# Patient Record
Sex: Male | Born: 1955 | Race: White | Hispanic: No | Marital: Married | Smoking: Current every day smoker
Health system: Southern US, Community
[De-identification: ages and names within clinical notes are randomized; demographics above are authoritative.]

## PROBLEM LIST (undated history)

## (undated) DIAGNOSIS — K219 Gastro-esophageal reflux disease without esophagitis: Secondary | ICD-10-CM

## (undated) HISTORY — PX: ANTERIOR CRUCIATE LIGAMENT REPAIR: SHX115

## (undated) HISTORY — PX: NECK SURGERY: SHX720

---

## 2015-12-02 ENCOUNTER — Other Ambulatory Visit: Payer: Self-pay | Admitting: Family Medicine

## 2015-12-02 DIAGNOSIS — R945 Abnormal results of liver function studies: Principal | ICD-10-CM

## 2015-12-02 DIAGNOSIS — R7989 Other specified abnormal findings of blood chemistry: Secondary | ICD-10-CM

## 2015-12-24 ENCOUNTER — Other Ambulatory Visit: Payer: Self-pay | Admitting: Family Medicine

## 2015-12-24 ENCOUNTER — Ambulatory Visit
Admission: RE | Admit: 2015-12-24 | Discharge: 2015-12-24 | Disposition: A | Discharge status: Home / Self Care | Payer: BLUE CROSS/BLUE SHIELD | Source: Ambulatory Visit | Attending: Family Medicine | Admitting: Family Medicine

## 2015-12-24 DIAGNOSIS — R945 Abnormal results of liver function studies: Principal | ICD-10-CM

## 2015-12-24 DIAGNOSIS — R7989 Other specified abnormal findings of blood chemistry: Secondary | ICD-10-CM

## 2015-12-24 DIAGNOSIS — M1610 Unilateral primary osteoarthritis, unspecified hip: Secondary | ICD-10-CM

## 2015-12-24 DIAGNOSIS — M1990 Unspecified osteoarthritis, unspecified site: Secondary | ICD-10-CM

## 2016-01-14 ENCOUNTER — Encounter: Payer: Self-pay | Admitting: Vascular Surgery

## 2016-01-22 ENCOUNTER — Ambulatory Visit (INDEPENDENT_AMBULATORY_CARE_PROVIDER_SITE_OTHER): Payer: BLUE CROSS/BLUE SHIELD | Admitting: Vascular Surgery

## 2016-01-22 ENCOUNTER — Encounter: Payer: Self-pay | Admitting: Vascular Surgery

## 2016-01-22 VITALS — BP 120/81 | HR 92 | Temp 98.4°F | Resp 16 | Ht 75.0 in | Wt 227.0 lb

## 2016-01-22 DIAGNOSIS — I714 Abdominal aortic aneurysm, without rupture, unspecified: Secondary | ICD-10-CM

## 2016-01-22 NOTE — Progress Notes (Signed)
History of Present Illness:  Patient is a 60 y.o. year old male who presents for evaluation of abdominal aortic aneurysm.  The aneurysm is currently 5.2cm in diameter by US performed [facility] on 12/24/2015.  The patient denies abdominal pain.  The patient denies back pain.  The patient hadenies family history of AAA.  Other medical problems include elevated LFTs and tobacco abuse.  He takes no prescriptions medications.  He denise DM, HTN and hypercholesterolemia.  History reviewed. No pertinent past medical history.  History reviewed. No pertinent surgical history.   Social History Social History  Substance Use Topics  . Smoking status: Current Every Day Smoker  . Smokeless tobacco: Never Used     Comment: Less than 1 pk per day.   . Alcohol use Not on file    Family History History reviewed. No pertinent family history.  Allergies  Not on File   No current outpatient prescriptions on file.   No current facility-administered medications for this visit.     ROS:   General:  No weight loss, Fever, chills  HEENT: No recent headaches, no nasal bleeding, no visual changes, no sore throat  Neurologic: No dizziness, blackouts, seizures. No recent symptoms of stroke or mini- stroke. No recent episodes of slurred speech, or temporary blindness.  Cardiac: No recent episodes of chest pain/pressure, no shortness of breath at rest.  No shortness of breath with exertion.  Denies history of atrial fibrillation or irregular heartbeat  Vascular: No history of rest pain in feet.  No history of claudication.  No history of non-healing ulcer, No history of DVT   Pulmonary: No home oxygen, no productive cough, no hemoptysis,  No asthma or wheezing  Musculoskeletal:  [ ]  Arthritis, [ ]  Low back pain,  [ ]  Joint pain  Hematologic:No history of hypercoagulable state.  No history of easy bleeding.  No history of anemia  Gastrointestinal: No hematochezia or melena,  No  gastroesophageal reflux, no trouble swallowing  Urinary: [ ]  chronic Kidney disease, [ ]  on HD - [ ]  MWF or [ ]  TTHS, [ ]  Burning with urination, [ ]  Frequent urination, [ ]  Difficulty urinating;   Skin: No rashes  Psychological: No history of anxiety,  No history of depression   Physical Examination  Vitals:   01/22/16 1503  BP: 120/81  Pulse: 92  Resp: 16  Temp: 98.4 F (36.9 C)  TempSrc: Oral  SpO2: 97%  Weight: 227 lb (103 kg)  Height: 6\' 3"  (1.905 m)    Body mass index is 28.37 kg/m.  General:  Alert and oriented, no acute distress HEENT: Normal Neck: No bruit or JVD Pulmonary: Clear to auscultation bilaterally Cardiac: Regular Rate and Rhythm without murmur Gastrointestinal: Soft, non-tender, non-distended, no mass, no scars Skin: No rash Extremity Pulses:  2+ radial, brachial, femoral, dorsalis pedis, posterior tibial pulses bilaterally Musculoskeletal: No deformity or edema  Neurologic: Upper and lower extremity motor 5/5 and symmetric  DATA:  Abdominal ultrasounds 12/24/2015 [facility]  Infrarenal AAA diameter of 5.2 x 5.1 cm  ASSESSMENT:  5.2 Infrarenal AAA asymptomatic  PLAN: We will order a CTA ABD/Pelvis and get him to a cardiologist for cardiac clearance.  It is recommended to repair the AAA when it is larger than 5.0 cm in diameter.  He will then follow up with Dr. Arbie CookeyEarly to plan open AAA verse EVAR fixation of the AAA.   COLLINS, EMMA MAUREEN PA-C [facility]  He was seen today in conjunction with Dr. Arbie Cookey  I have examined the patient, reviewed and agree with above.Discussed options with the patient and his wife present at length. Will obtain CT scan to determine if he is a anatomic candidate for stent graft repair. Does not have any prior cardiac history but will obtain cardiac clearance prior to surgery. We'll see him back for continued discussion following CT imaging  Gretta Began, MD 01/22/2016 4:36  PM '

## 2016-01-23 ENCOUNTER — Other Ambulatory Visit: Payer: Self-pay | Admitting: Vascular Surgery

## 2016-01-23 ENCOUNTER — Other Ambulatory Visit: Payer: Self-pay

## 2016-01-23 DIAGNOSIS — I714 Abdominal aortic aneurysm, without rupture, unspecified: Secondary | ICD-10-CM

## 2016-01-23 NOTE — Addendum Note (Signed)
Addended by: Yolonda KidaEVANS, Arthur Aydelotte N on: 01/23/2016 02:37 PM   Modules accepted: Orders

## 2016-01-29 ENCOUNTER — Encounter: Payer: Self-pay | Admitting: Cardiovascular Disease

## 2016-01-29 ENCOUNTER — Ambulatory Visit (INDEPENDENT_AMBULATORY_CARE_PROVIDER_SITE_OTHER): Payer: BLUE CROSS/BLUE SHIELD | Admitting: Cardiovascular Disease

## 2016-01-29 VITALS — BP 130/80 | HR 64 | Ht 75.0 in | Wt 220.0 lb

## 2016-01-29 DIAGNOSIS — I714 Abdominal aortic aneurysm, without rupture, unspecified: Secondary | ICD-10-CM | POA: Insufficient documentation

## 2016-01-29 DIAGNOSIS — Z01818 Encounter for other preprocedural examination: Secondary | ICD-10-CM | POA: Diagnosis not present

## 2016-01-29 NOTE — Progress Notes (Signed)
     01/29/2016 Kyle Luna   August 02, 1955  [id number]  Primary Physician Maryelizabeth RowanEWEY,ELIZABETH, MD Primary Cardiologist: Runell GessJonathan J Francesa Eugenio MD Roseanne RenoFACP, FACC, FAHA, FSCAI  HPI:  Kyle Luna is a delightful 60 year old mildly overweight married Caucasian male who children referred by Dr. EARLY for preoperative clearance before abdominal aortic aneurysm revascularization. He has no prior cardiac history. Risk factors 20-pack-years of tobacco abuse smoking one half pack per day. He's never had a heart attack or stroke. Denies chest pain or shortness of breath. Apparently an abdominal ultrasound showed a 5 cm abdominal aortic aneurysm which led to referral to vascular surgery. He is scheduled for a CT scan in the upcoming future to determine suitability for endoluminal stent grafting. He was referred here for preoperative clearance.   No current outpatient prescriptions on file.   No current facility-administered medications for this visit.     No Known Allergies  Social History   Social History  . Marital status: Married    Spouse name: N/A  . Number of children: N/A  . Years of education: N/A   Occupational History  . Not on file.   Social History Main Topics  . Smoking status: Current Every Day Smoker  . Smokeless tobacco: Never Used     Comment: Less than 1 pk per day.   . Alcohol use 2.4 - 3.0 oz/week    4 - 5 Cans of beer per week  . Drug use: No  . Sexual activity: Not on file   Other Topics Concern  . Not on file   Social History Narrative  . No narrative on file     Review of Systems: General: negative for chills, fever, night sweats or weight changes.  Cardiovascular: negative for chest pain, dyspnea on exertion, edema, orthopnea, palpitations, paroxysmal nocturnal dyspnea or shortness of breath Dermatological: negative for rash Respiratory: negative for cough or wheezing Urologic: negative for hematuria Abdominal: negative for nausea, vomiting, diarrhea, bright red  blood per rectum, melena, or hematemesis Neurologic: negative for visual changes, syncope, or dizziness All other systems reviewed and are otherwise negative except as noted above.    Blood pressure 130/80, pulse 64, height 6\' 3"  (1.905 m), weight 220 lb (99.8 kg).  General appearance: alert and no distress Neck: no adenopathy, no carotid bruit, no JVD, supple, symmetrical, trachea midline and thyroid not enlarged, symmetric, no tenderness/mass/nodules Lungs: clear to auscultation bilaterally Heart: regular rate and rhythm, S1, S2 normal, no murmur, click, rub or gallop Extremities: extremities normal, atraumatic, no cyanosis or edema  EKG normal sinus rhythm at 64 without ST or T-wave changes. I personally reviewed this EKG  ASSESSMENT AND PLAN:   Abdominal aortic aneurysm (AAA) [facility](HCC) Kyle Luna were presents today for preoperative clearance before abdominal aortic aneurysm repair by Dr. Arbie CookeyEarly. His resection profiles are notable for 20-pack-years of tobacco continued to smoke one half pack per day. There is no history of diabetes, hypertension or hyperlipidemia. He was adopted so there is no known family history. He denies chest pain or shortness of breath. Never had a heart attack or stroke. While he may be a candidate for endoluminal stent grafting aortobifemoral bypass grafting remains a possibility. I'm going to obtain a pharmacologic Myoview stress test to risk stratify him.      Runell GessJonathan J. Rudra Hobbins MD FACP,FACC,FAHA, [facility] 01/29/2016 8:55 AM

## 2016-01-29 NOTE — Patient Instructions (Signed)
Medication Instructions:  Your physician recommends that you continue on your current medications as directed. Please refer to the Current Medication list given to you today.  Labwork: none  Testing/Procedures: Your physician has requested that you have a lexiscan myoview. For further information please visit [email or web address]. Please follow instruction sheet, as given.  Follow-Up: As needed     

## 2016-01-29 NOTE — Assessment & Plan Note (Signed)
Mr. Kyle Luna were presents today for preoperative clearance before abdominal aortic aneurysm repair by Dr. Arbie CookeyEarly. His resection profiles are notable for 20-pack-years of tobacco continued to smoke one half pack per day. There is no history of diabetes, hypertension or hyperlipidemia. He was adopted so there is no known family history. He denies chest pain or shortness of breath. Never had a heart attack or stroke. While he may be a candidate for endoluminal stent grafting aortobifemoral bypass grafting remains a possibility. I'm going to obtain a pharmacologic Myoview stress test to risk stratify him.

## 2016-01-30 ENCOUNTER — Encounter (HOSPITAL_COMMUNITY): Payer: BLUE CROSS/BLUE SHIELD

## 2016-01-30 ENCOUNTER — Encounter: Payer: Self-pay | Admitting: Vascular Surgery

## 2016-01-31 ENCOUNTER — Ambulatory Visit
Admission: RE | Admit: 2016-01-31 | Discharge: 2016-01-31 | Disposition: A | Discharge status: Home / Self Care | Payer: BLUE CROSS/BLUE SHIELD | Source: Ambulatory Visit | Attending: Vascular Surgery | Admitting: Vascular Surgery

## 2016-01-31 DIAGNOSIS — I714 Abdominal aortic aneurysm, without rupture, unspecified: Secondary | ICD-10-CM

## 2016-01-31 MED ORDER — IOPAMIDOL (ISOVUE-370) INJECTION 76%
80.0000 mL | Freq: Once | INTRAVENOUS | Status: AC | PRN
  Administered 2016-01-31: 80 mL via INTRAVENOUS

## 2016-02-04 ENCOUNTER — Ambulatory Visit (INDEPENDENT_AMBULATORY_CARE_PROVIDER_SITE_OTHER): Payer: BLUE CROSS/BLUE SHIELD | Admitting: Vascular Surgery

## 2016-02-04 ENCOUNTER — Encounter: Payer: Self-pay | Admitting: Vascular Surgery

## 2016-02-04 VITALS — BP 137/91 | HR 77 | Ht 75.0 in | Wt 220.8 lb

## 2016-02-04 DIAGNOSIS — I714 Abdominal aortic aneurysm, without rupture, unspecified: Secondary | ICD-10-CM

## 2016-02-04 NOTE — Progress Notes (Signed)
Patient name: Kyle Luna Lanzer MRN: [id number] DOB: 11/14/1955 Sex: male  REASON FOR VISIT: Follow-up abdominal aortic aneurysm and discuss recent CT  HPI: Kyle Luna Nobrega is a 60 y.o. male here for continued discussion of his abdominal aortic aneurysm. I'd seen him several weeks ago in initial consultation. He was found also to have a 5.2 cm asymptomatic infrarenal abdominal aortic aneurysm. He underwent CT angiogram of his abdomen and pelvis on 01/31/2016. I have reviewed his actual films and reports and have reviewed his films today with the patient and his wife present. He has no symptoms of his aneurysm and no change in his medical history since her visit several weeks ago. He does have a cardiology appointment for clearance on 02/07/2016  History reviewed. No pertinent past medical history.  Family History  Problem Relation Age of Onset  . Adopted: Yes    SOCIAL HISTORY: Social History  Substance Use Topics  . Smoking status: Current Every Day Smoker  . Smokeless tobacco: Never Used     Comment: Less than 1 pk per day.   . Alcohol use 2.4 - 3.0 oz/week    4 - 5 Cans of beer per week    No Known Allergies  No current outpatient prescriptions on file.   No current facility-administered medications for this visit.     REVIEW OF SYSTEMS:  [X]  denotes positive finding, [ ]  denotes negative finding Cardiac  Comments:  Chest pain or chest pressure:    Shortness of breath upon exertion:    Short of breath when lying flat:    Irregular heart rhythm:        Vascular    Pain in calf, thigh, or hip brought on by ambulation:    Pain in feet at night that wakes you up from your sleep:     Blood clot in your veins:    Leg swelling:         Pulmonary    Oxygen at home:    Productive cough:     Wheezing:         Neurologic    Sudden weakness in arms or legs:     Sudden numbness in arms or legs:     Sudden onset of difficulty speaking or slurred speech:    Temporary loss of  vision in one eye:     Problems with dizziness:         Gastrointestinal    Blood in stool:     Vomited blood:         Genitourinary    Burning when urinating:     Blood in urine:        Psychiatric    Major depression:         Hematologic    Bleeding problems:    Problems with blood clotting too easily:        Skin    Rashes or ulcers:        Constitutional    Fever or chills:      PHYSICAL EXAM: Vitals:   02/04/16 0903  BP: (!) 137/91  Pulse: 77  SpO2: 97%  Weight: 220 lb 12.8 oz (100.2 kg)  Height: 6\' 3"  (1.905 m)    GENERAL: The patient is a well-nourished male, in no acute distress. The vital signs are documented above. PULMONARY: There is good air exchange  MUSCULOSKELETAL: There are no major deformities or cyanosis. NEUROLOGIC: No focal weakness or paresthesias are detected. SKIN: There are no ulcers or rashes  noted. PSYCHIATRIC: The patient has a normal affect.  DATA:   CT scan reveals an infrarenal abdominal aortic aneurysm with maximal diameter 5.3 cm. He does have tension down to the iliac arteries but does not extend into the iliac arteries. He does have good caliber iliac vessels for potential delivery of the stent graft. 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  MEDICAL ISSUES:  Again had long discussion with the patient and his wife regarding the options of stent graft repair and open aneurysm repair. Did explain the potential for Endo leaks with stent graft repair and the requirement for ongoing lifelong surveillance. I will review his CT scan with the device manufacturer. Explained would in all likelihood require coverage of his lower pole accessory renal arteries but this typically does not cause any untoward events. Will make final recommendation following cardiac clearance and after review of the CT scan. He wishes to proceed as soon as possible    Jakwan Sally, Sales promotion account executive  of [employer] [phone]

## 2016-02-05 ENCOUNTER — Telehealth (HOSPITAL_COMMUNITY): Payer: Self-pay

## 2016-02-05 NOTE — Telephone Encounter (Signed)
Encounter complete. 

## 2016-02-07 ENCOUNTER — Ambulatory Visit (HOSPITAL_COMMUNITY)
Admission: RE | Admit: 2016-02-07 | Discharge: 2016-02-07 | Disposition: A | Discharge status: Home / Self Care | Payer: BLUE CROSS/BLUE SHIELD | Source: Ambulatory Visit | Attending: Cardiology | Admitting: Cardiology

## 2016-02-07 DIAGNOSIS — Z720 Tobacco use: Secondary | ICD-10-CM | POA: Insufficient documentation

## 2016-02-07 DIAGNOSIS — Z01818 Encounter for other preprocedural examination: Secondary | ICD-10-CM

## 2016-02-07 DIAGNOSIS — I714 Abdominal aortic aneurysm, without rupture, unspecified: Secondary | ICD-10-CM

## 2016-02-07 LAB — MYOCARDIAL PERFUSION IMAGING
CHL CUP NUCLEAR SRS: 5
CSEPPHR: 89 {beats}/min
LV dias vol: 94 mL (ref 62–150)
LV sys vol: 45 mL
Rest HR: 70 {beats}/min
SDS: 0
SSS: 5
TID: 1.24

## 2016-02-07 MED ORDER — TECHNETIUM TC 99M TETROFOSMIN IV KIT
10.7000 | PACK | Freq: Once | INTRAVENOUS | Status: AC | PRN
  Administered 2016-02-07: 11 via INTRAVENOUS
  Filled 2016-02-07: qty 11

## 2016-02-07 MED ORDER — TECHNETIUM TC 99M TETROFOSMIN IV KIT
31.2000 | PACK | Freq: Once | INTRAVENOUS | Status: AC | PRN
  Administered 2016-02-07: 31.2 via INTRAVENOUS
  Filled 2016-02-07: qty 31

## 2016-02-07 MED ORDER — REGADENOSON 0.4 MG/5ML IV SOLN
0.4000 mg | Freq: Once | INTRAVENOUS | Status: AC
  Administered 2016-02-07: 0.4 mg via INTRAVENOUS

## 2016-02-10 ENCOUNTER — Other Ambulatory Visit: Payer: Self-pay

## 2016-02-28 NOTE — Pre-Procedure Instructions (Signed)
Kyle Luna  02/28/2016      [id number] - [name], [place] - [address] AT [facility] [address] [facility] [name] [name] [address] Phone: [phone] Fax: [phone]    Your procedure is scheduled on Monday, October 23.  Report to [facility] Admitting at 5:30 AM               Your surgery or procedure is scheduled for 7:30 AM   Call this number if you have problems the morning of [phone]                 For any other questions, please call [phone], Monday - Friday 8 AM - 4 PM.   Remember:  Do not eat food or drink liquids after midnight: Sunday, October 23.               1 Week prior to surgery STOP taking Aspirin , Aspirin Products (Goody Powder, Excedrin Migraine), Ibuprofen (Advil), Naproxen (Aleve), Vitamins and Herbal Products (ie Fish Oil                                         [place]- Preparing For Surgery Before surgery, you can play an important role. Because skin is not sterile, your skin needs to be as free of germs as possible. You can reduce the number of germs on your skin by washing with CHG (chlorahexidine gluconate) Soap before surgery.  CHG is an antiseptic cleaner which kills germs and bonds with the skin to continue killing germs even after washing.  Please do not use if you have an allergy to CHG or antibacterial soaps. If your skin becomes reddened/irritated stop using the CHG.  Do not shave (including legs and underarms) for at least 48 hours prior to first CHG shower. It is OK to shave your face.  Please follow these instructions carefully.   1. Shower the NIGHT BEFORE SURGERY and the MORNING OF SURGERY with CHG.   2. If you chose to wash your hair, wash your hair first as usual with your normal shampoo.  3. After you shampoo, rinse your hair and body thoroughly to remove the shampoo.  4. Use CHG as you would any other liquid soap. You can apply CHG directly to the skin and wash  gently with a scrungie or a clean washcloth.   5. Apply the CHG Soap to your body ONLY FROM THE NECK DOWN.  Do not use on open wounds or open sores. Avoid contact with your eyes, ears, mouth and genitals (private parts). Wash genitals (private parts) with your normal soap.  6. Wash thoroughly, paying special attention to the area where your surgery will be performed.  7. Thoroughly rinse your body with warm water from the neck down.  8. DO NOT shower/wash with your normal soap after using and rinsing off the CHG Soap.  9. Pat yourself dry with a CLEAN TOWEL.   10. Wear CLEAN PAJAMAS   11. Place CLEAN SHEETS on your bed the night of your first shower and DO NOT SLEEP WITH PETS.  Day of Surgery: Do not apply any deodorants/lotions. Please wear clean clothes to the hospital.  Do not wear jewelry, make-up or nail polish.  Do not wear lotions, powders, or perfumes, or deodorant.             Men may shave face and neck.  Do not bring valuables to the hospital.  [facility] is not responsible for any belongings or valuables.  Contacts, dentures or bridgework may not be worn into surgery.  Leave your suitcase in the car.  After surgery it may be brought to your room.  For patients admitted to the hospital, discharge time will be determined by your treatment team.  Special instructions:    Please read over the following fact sheets that you were given: [facility]- Preparing For Surgery and Patient Instructions for Mupirocin Application, Pain Booklet, Coughing and Deep Breathing.

## 2016-03-02 ENCOUNTER — Encounter (HOSPITAL_COMMUNITY)
Admission: RE | Admit: 2016-03-02 | Discharge: 2016-03-02 | Disposition: A | Discharge status: Home / Self Care | Payer: BLUE CROSS/BLUE SHIELD | Source: Ambulatory Visit | Attending: Vascular Surgery | Admitting: Vascular Surgery

## 2016-03-02 ENCOUNTER — Telehealth: Payer: Self-pay

## 2016-03-02 ENCOUNTER — Encounter (HOSPITAL_COMMUNITY): Payer: Self-pay

## 2016-03-02 DIAGNOSIS — I714 Abdominal aortic aneurysm, without rupture: Secondary | ICD-10-CM | POA: Insufficient documentation

## 2016-03-02 DIAGNOSIS — Z01812 Encounter for preprocedural laboratory examination: Secondary | ICD-10-CM | POA: Diagnosis not present

## 2016-03-02 HISTORY — DX: Gastro-esophageal reflux disease without esophagitis: K21.9

## 2016-03-02 LAB — BLOOD GAS, ARTERIAL
ACID-BASE EXCESS: 2 mmol/L (ref 0.0–2.0)
BICARBONATE: 26 mmol/L (ref 20.0–28.0)
DRAWN BY: 257081
FIO2: 21
O2 SAT: 96.4 %
PATIENT TEMPERATURE: 98.6
PH ART: 7.422 (ref 7.350–7.450)
pCO2 arterial: 40.7 mmHg (ref 32.0–48.0)
pO2, Arterial: 85.1 mmHg (ref 83.0–108.0)

## 2016-03-02 LAB — TYPE AND SCREEN
ABO/RH(D): A POS
ANTIBODY SCREEN: NEGATIVE

## 2016-03-02 LAB — CBC
HEMATOCRIT: 47.1 % (ref 39.0–52.0)
Hemoglobin: 16.3 g/dL (ref 13.0–17.0)
MCH: 31.3 pg (ref 26.0–34.0)
MCHC: 34.6 g/dL (ref 30.0–36.0)
MCV: 90.4 fL (ref 78.0–100.0)
PLATELETS: 291 10*3/uL (ref 150–400)
RBC: 5.21 MIL/uL (ref 4.22–5.81)
RDW: 14.2 % (ref 11.5–15.5)
WBC: 8.1 10*3/uL (ref 4.0–10.5)

## 2016-03-02 LAB — URINALYSIS, ROUTINE W REFLEX MICROSCOPIC
BILIRUBIN URINE: NEGATIVE
Glucose, UA: NEGATIVE mg/dL
HGB URINE DIPSTICK: NEGATIVE
KETONES UR: 15 mg/dL — AB
Leukocytes, UA: NEGATIVE
NITRITE: NEGATIVE
PH: 5.5 (ref 5.0–8.0)
Protein, ur: NEGATIVE mg/dL
SPECIFIC GRAVITY, URINE: 1.024 (ref 1.005–1.030)

## 2016-03-02 LAB — SURGICAL PCR SCREEN
MRSA, PCR: NEGATIVE
Staphylococcus aureus: NEGATIVE

## 2016-03-02 LAB — COMPREHENSIVE METABOLIC PANEL
ALBUMIN: 4.3 g/dL (ref 3.5–5.0)
ALT: 57 U/L (ref 17–63)
ANION GAP: 10 (ref 5–15)
AST: 39 U/L (ref 15–41)
Alkaline Phosphatase: 57 U/L (ref 38–126)
BILIRUBIN TOTAL: 0.7 mg/dL (ref 0.3–1.2)
BUN: 15 mg/dL (ref 6–20)
CHLORIDE: 104 mmol/L (ref 101–111)
CO2: 26 mmol/L (ref 22–32)
Calcium: 10 mg/dL (ref 8.9–10.3)
Creatinine, Ser: 0.93 mg/dL (ref 0.61–1.24)
GFR calc Af Amer: >60 mL/min (ref >60)
GFR calc non Af Amer: >60 mL/min (ref >60)
GLUCOSE: 95 mg/dL (ref 65–99)
POTASSIUM: 4.5 mmol/L (ref 3.5–5.1)
SODIUM: 140 mmol/L (ref 135–145)
TOTAL PROTEIN: 7.9 g/dL (ref 6.5–8.1)

## 2016-03-02 LAB — ABO/RH: ABO/RH(D): A POS

## 2016-03-02 LAB — APTT: APTT: 32 s (ref 24–36)

## 2016-03-02 LAB — PROTIME-INR
INR: 0.95
PROTHROMBIN TIME: 12.7 s (ref 11.4–15.2)

## 2016-03-02 NOTE — Telephone Encounter (Signed)
Pt's wife called to request medication for anxiety or sleep.  Reported the pt. Is becoming increasingly anxious about the surgery scheduled to repair his abdominal aneurysm on 10/23.  Stated that he is afraid he is going to die.  Advised that she will need to contact his PCP re: any anti-anxiety or sleeping medication.  The wife verbalized that she felt this should be handled by the surgeon, and questioned this.  Wife stated that the pt. gets extremely anxious for dental procedures, and his BP will raise to 199/100; stated "so you can imagine what he is going through facing the aneurysm surgery.  Advised that to try his PCP, and if there is any problem with getting an Rx for the above, to contact our office again, and nurse will discuss with Dr. Arbie CookeyEarly.  Wife verb. Understanding.

## 2016-03-08 NOTE — Anesthesia Preprocedure Evaluation (Signed)
Anesthesia Evaluation  Patient identified by MRN, date of birth, ID band Patient awake    Reviewed: Allergy & Precautions, H&P , NPO status , Patient's Chart, lab work & pertinent test results  History of Anesthesia Complications Negative for: history of anesthetic complications  Airway Mallampati: II  TM Distance: >3 FB Neck ROM: full    Dental no notable dental hx.    Pulmonary Current Smoker,    Pulmonary exam normal breath sounds clear to auscultation       Cardiovascular + Peripheral Vascular Disease  Normal cardiovascular exam Rhythm:regular Rate:Normal  5.2cm infrarenal aneurysm, negative stress test on 01/30/16   Neuro/Psych negative neurological ROS     GI/Hepatic negative GI ROS, Neg liver ROS,   Endo/Other  negative endocrine ROS  Renal/GU negative Renal ROS     Musculoskeletal   Abdominal   Peds  Hematology negative hematology ROS (+)   Anesthesia Other Findings   Reproductive/Obstetrics negative OB ROS                             Anesthesia Physical Anesthesia Plan  ASA: III  Anesthesia Plan: General   Post-op Pain Management:    Induction: Intravenous  Airway Management Planned: Oral ETT  Additional Equipment: Arterial line and CVP  Intra-op Plan:   Post-operative Plan: Possible Post-op intubation/ventilation  Informed Consent: I have reviewed the patients History and Physical, chart, labs and discussed the procedure including the risks, benefits and alternatives for the proposed anesthesia with the patient or authorized representative who has indicated his/her understanding and acceptance.   Dental Advisory Given  Plan Discussed with: Anesthesiologist, CRNA and Surgeon  Anesthesia Plan Comments:         Anesthesia Quick Evaluation

## 2016-03-09 ENCOUNTER — Inpatient Hospital Stay (HOSPITAL_COMMUNITY): Payer: BLUE CROSS/BLUE SHIELD | Admitting: Certified Registered Nurse Anesthetist

## 2016-03-09 ENCOUNTER — Encounter (HOSPITAL_COMMUNITY): Payer: Self-pay | Admitting: Certified Registered Nurse Anesthetist

## 2016-03-09 ENCOUNTER — Encounter (HOSPITAL_COMMUNITY)
Admission: RE | Disposition: A | Discharge status: Home / Self Care | Payer: Self-pay | Source: Ambulatory Visit | Attending: Vascular Surgery

## 2016-03-09 ENCOUNTER — Inpatient Hospital Stay (HOSPITAL_COMMUNITY): Payer: BLUE CROSS/BLUE SHIELD

## 2016-03-09 ENCOUNTER — Inpatient Hospital Stay (HOSPITAL_COMMUNITY)
Admission: RE | Admit: 2016-03-09 | Discharge: 2016-03-14 | DRG: 269 | Disposition: A | Discharge status: Home / Self Care | Payer: BLUE CROSS/BLUE SHIELD | Source: Ambulatory Visit | Attending: Vascular Surgery | Admitting: Vascular Surgery

## 2016-03-09 DIAGNOSIS — R262 Difficulty in walking, not elsewhere classified: Secondary | ICD-10-CM

## 2016-03-09 DIAGNOSIS — Z9889 Other specified postprocedural states: Secondary | ICD-10-CM

## 2016-03-09 DIAGNOSIS — I10 Essential (primary) hypertension: Secondary | ICD-10-CM | POA: Diagnosis present

## 2016-03-09 DIAGNOSIS — I714 Abdominal aortic aneurysm, without rupture, unspecified: Secondary | ICD-10-CM | POA: Diagnosis present

## 2016-03-09 DIAGNOSIS — F17200 Nicotine dependence, unspecified, uncomplicated: Secondary | ICD-10-CM | POA: Diagnosis present

## 2016-03-09 DIAGNOSIS — Z7289 Other problems related to lifestyle: Secondary | ICD-10-CM | POA: Diagnosis not present

## 2016-03-09 HISTORY — PX: ABDOMINAL AORTIC ANEURYSM REPAIR: SHX42

## 2016-03-09 LAB — CBC
HCT: 38.8 % — ABNORMAL LOW (ref 39.0–52.0)
HEMATOCRIT: 39.9 % (ref 39.0–52.0)
Hemoglobin: 13.3 g/dL (ref 13.0–17.0)
Hemoglobin: 13.8 g/dL (ref 13.0–17.0)
MCH: 31.1 pg (ref 26.0–34.0)
MCH: 31.7 pg (ref 26.0–34.0)
MCHC: 34.3 g/dL (ref 30.0–36.0)
MCHC: 34.6 g/dL (ref 30.0–36.0)
MCV: 90.9 fL (ref 78.0–100.0)
MCV: 91.5 fL (ref 78.0–100.0)
PLATELETS: 243 10*3/uL (ref 150–400)
PLATELETS: 239 10*3/uL (ref 150–400)
RBC: 4.27 MIL/uL (ref 4.22–5.81)
RBC: 4.36 MIL/uL (ref 4.22–5.81)
RDW: 14 % (ref 11.5–15.5)
RDW: 14 % (ref 11.5–15.5)
WBC: 14.7 10*3/uL — ABNORMAL HIGH (ref 4.0–10.5)
WBC: 14.3 10*3/uL — AB (ref 4.0–10.5)

## 2016-03-09 LAB — BLOOD GAS, ARTERIAL
Acid-Base Excess: 0.8 mmol/L (ref 0.0–2.0)
BICARBONATE: 26 mmol/L (ref 20.0–28.0)
FIO2: 32
O2 Saturation: 96.8 %
PATIENT TEMPERATURE: 97.2
PH ART: 7.342 — AB (ref 7.350–7.450)
PO2 ART: 96.2 mmHg (ref 83.0–108.0)
pCO2 arterial: 48.8 mmHg — ABNORMAL HIGH (ref 32.0–48.0)

## 2016-03-09 LAB — PROTIME-INR
INR: 1.04
PROTHROMBIN TIME: 13.6 s (ref 11.4–15.2)

## 2016-03-09 LAB — BASIC METABOLIC PANEL
Anion gap: 7 (ref 5–15)
BUN: 12 mg/dL (ref 6–20)
CO2: 25 mmol/L (ref 22–32)
CREATININE: 0.87 mg/dL (ref 0.61–1.24)
Calcium: 9 mg/dL (ref 8.9–10.3)
Chloride: 107 mmol/L (ref 101–111)
GFR calc Af Amer: >60 mL/min (ref >60)
GLUCOSE: 137 mg/dL — AB (ref 65–99)
POTASSIUM: 3.9 mmol/L (ref 3.5–5.1)
SODIUM: 139 mmol/L (ref 135–145)

## 2016-03-09 LAB — MAGNESIUM: MAGNESIUM: 1.8 mg/dL (ref 1.7–2.4)

## 2016-03-09 LAB — APTT: aPTT: 30 seconds (ref 24–36)

## 2016-03-09 SURGERY — ANEURYSM ABDOMINAL AORTIC REPAIR
Anesthesia: General

## 2016-03-09 MED ORDER — SODIUM CHLORIDE 0.9 % IV SOLN: 500.0000 mL | Freq: Once | INTRAVENOUS | Status: DC | PRN

## 2016-03-09 MED ORDER — ACETAMINOPHEN 325 MG RE SUPP: 325.0000 mg | RECTAL | Status: DC | PRN

## 2016-03-09 MED ORDER — PHENYLEPHRINE HCL 10 MG/ML IJ SOLN
INTRAMUSCULAR | Status: DC | PRN
  Administered 2016-03-09: 20 ug/min via INTRAVENOUS

## 2016-03-09 MED ORDER — PROPOFOL 10 MG/ML IV BOLUS
INTRAVENOUS | Status: DC | PRN
  Administered 2016-03-09 (×2): 100 mg via INTRAVENOUS

## 2016-03-09 MED ORDER — CHLORHEXIDINE GLUCONATE CLOTH 2 % EX PADS: 6.0000 | MEDICATED_PAD | Freq: Once | CUTANEOUS | Status: DC

## 2016-03-09 MED ORDER — ALUM & MAG HYDROXIDE-SIMETH 200-200-20 MG/5ML PO SUSP: 15.0000 mL | ORAL | Status: DC | PRN

## 2016-03-09 MED ORDER — HYDROMORPHONE HCL 1 MG/ML IJ SOLN
INTRAMUSCULAR | Status: AC
  Filled 2016-03-09: qty 0.5

## 2016-03-09 MED ORDER — FENTANYL CITRATE (PF) 100 MCG/2ML IJ SOLN
INTRAMUSCULAR | Status: DC | PRN
  Administered 2016-03-09: 100 ug via INTRAVENOUS
  Administered 2016-03-09 (×3): 50 ug via INTRAVENOUS
  Administered 2016-03-09: 25 ug via INTRAVENOUS
  Administered 2016-03-09: 50 ug via INTRAVENOUS
  Administered 2016-03-09: 75 ug via INTRAVENOUS
  Administered 2016-03-09 (×2): 50 ug via INTRAVENOUS

## 2016-03-09 MED ORDER — PHENOL 1.4 % MT LIQD
1.0000 | OROMUCOSAL | Status: DC | PRN
Start: 2016-03-09 — End: 2016-03-14

## 2016-03-09 MED ORDER — ONDANSETRON HCL 4 MG/2ML IJ SOLN
4.0000 mg | Freq: Four times a day (QID) | INTRAMUSCULAR | Status: DC | PRN
Start: 2016-03-09 — End: 2016-03-14

## 2016-03-09 MED ORDER — ENOXAPARIN SODIUM 40 MG/0.4ML ~~LOC~~ SOLN
40.0000 mg | SUBCUTANEOUS | Status: DC
  Administered 2016-03-10 – 2016-03-14 (×5): 40 mg via SUBCUTANEOUS
  Filled 2016-03-09 (×6): qty 0.4

## 2016-03-09 MED ORDER — LIDOCAINE HCL (CARDIAC) 20 MG/ML IV SOLN
INTRAVENOUS | Status: DC | PRN
  Administered 2016-03-09: 100 mg via INTRAVENOUS

## 2016-03-09 MED ORDER — ONDANSETRON HCL 4 MG/2ML IJ SOLN: 4.0000 mg | Freq: Once | INTRAMUSCULAR | Status: DC | PRN

## 2016-03-09 MED ORDER — OXYCODONE HCL 5 MG/5ML PO SOLN: 5.0000 mg | Freq: Once | ORAL | Status: DC | PRN

## 2016-03-09 MED ORDER — ONDANSETRON HCL 4 MG/2ML IJ SOLN
INTRAMUSCULAR | Status: DC | PRN
Start: 2016-03-09 — End: 2016-03-09
  Administered 2016-03-09: 4 mg via INTRAVENOUS

## 2016-03-09 MED ORDER — FENTANYL CITRATE (PF) 100 MCG/2ML IJ SOLN
INTRAMUSCULAR | Status: AC
  Filled 2016-03-09: qty 2

## 2016-03-09 MED ORDER — METOPROLOL TARTRATE 5 MG/5ML IV SOLN: 2.0000 mg | INTRAVENOUS | Status: DC | PRN

## 2016-03-09 MED ORDER — PHENYLEPHRINE 40 MCG/ML (10ML) SYRINGE FOR IV PUSH (FOR BLOOD PRESSURE SUPPORT)
PREFILLED_SYRINGE | INTRAVENOUS | Status: AC
  Filled 2016-03-09: qty 10

## 2016-03-09 MED ORDER — GUAIFENESIN-DM 100-10 MG/5ML PO SYRP: 15.0000 mL | ORAL_SOLUTION | ORAL | Status: DC | PRN

## 2016-03-09 MED ORDER — DEXTROSE 5 % IV SOLN
1.5000 g | INTRAVENOUS | Status: AC
  Administered 2016-03-09: 1.5 g via INTRAVENOUS
  Filled 2016-03-09: qty 1.5

## 2016-03-09 MED ORDER — SODIUM CHLORIDE 0.9 % IV SOLN
INTRAVENOUS | Status: DC | PRN
  Administered 2016-03-09: 500 mL

## 2016-03-09 MED ORDER — LABETALOL HCL 5 MG/ML IV SOLN
10.0000 mg | INTRAVENOUS | Status: DC | PRN
  Administered 2016-03-09 – 2016-03-12 (×2): 10 mg via INTRAVENOUS
  Filled 2016-03-09 (×2): qty 4

## 2016-03-09 MED ORDER — SUGAMMADEX SODIUM 200 MG/2ML IV SOLN
INTRAVENOUS | Status: AC
  Filled 2016-03-09: qty 2

## 2016-03-09 MED ORDER — MIDAZOLAM HCL 5 MG/5ML IJ SOLN
INTRAMUSCULAR | Status: DC | PRN
  Administered 2016-03-09 (×2): 2 mg via INTRAVENOUS

## 2016-03-09 MED ORDER — POTASSIUM CHLORIDE CRYS ER 20 MEQ PO TBCR: 20.0000 meq | EXTENDED_RELEASE_TABLET | Freq: Every day | ORAL | Status: DC | PRN

## 2016-03-09 MED ORDER — NITROGLYCERIN IN D5W 200-5 MCG/ML-% IV SOLN: INTRAVENOUS | Status: DC | PRN

## 2016-03-09 MED ORDER — NITROGLYCERIN IN D5W 200-5 MCG/ML-% IV SOLN
INTRAVENOUS | Status: DC | PRN
  Administered 2016-03-09: 10 ug/min via INTRAVENOUS

## 2016-03-09 MED ORDER — DEXTROSE 5 % IV SOLN
1.5000 g | Freq: Two times a day (BID) | INTRAVENOUS | Status: AC
  Administered 2016-03-09 – 2016-03-10 (×2): 1.5 g via INTRAVENOUS
  Filled 2016-03-09 (×3): qty 1.5

## 2016-03-09 MED ORDER — ROCURONIUM BROMIDE 100 MG/10ML IV SOLN
INTRAVENOUS | Status: DC | PRN
  Administered 2016-03-09: 70 mg via INTRAVENOUS
  Administered 2016-03-09: 20 mg via INTRAVENOUS
  Administered 2016-03-09 (×2): 10 mg via INTRAVENOUS

## 2016-03-09 MED ORDER — LACTATED RINGERS IV SOLN
INTRAVENOUS | Status: DC | PRN
  Administered 2016-03-09 (×2): via INTRAVENOUS

## 2016-03-09 MED ORDER — ALBUMIN HUMAN 5 % IV SOLN
INTRAVENOUS | Status: DC | PRN
  Administered 2016-03-09: 09:00:00 via INTRAVENOUS

## 2016-03-09 MED ORDER — LABETALOL HCL 5 MG/ML IV SOLN
INTRAVENOUS | Status: DC | PRN
  Administered 2016-03-09: 5 mg via INTRAVENOUS

## 2016-03-09 MED ORDER — MORPHINE SULFATE (PF) 2 MG/ML IV SOLN
2.0000 mg | INTRAVENOUS | Status: DC | PRN
  Administered 2016-03-09 (×2): 4 mg via INTRAVENOUS
  Administered 2016-03-09 (×3): 2 mg via INTRAVENOUS
  Administered 2016-03-09: 4 mg via INTRAVENOUS
  Administered 2016-03-09: 2 mg via INTRAVENOUS
  Administered 2016-03-10: 4 mg via INTRAVENOUS
  Administered 2016-03-10: 5 mg via INTRAVENOUS
  Administered 2016-03-10 (×3): 4 mg via INTRAVENOUS
  Filled 2016-03-09: qty 2
  Filled 2016-03-09 (×3): qty 1
  Filled 2016-03-09 (×2): qty 2
  Filled 2016-03-09: qty 3
  Filled 2016-03-09 (×3): qty 2
  Filled 2016-03-09: qty 1
  Filled 2016-03-09: qty 2

## 2016-03-09 MED ORDER — MAGNESIUM SULFATE 2 GM/50ML IV SOLN
2.0000 g | Freq: Every day | INTRAVENOUS | Status: DC | PRN
  Filled 2016-03-09: qty 50

## 2016-03-09 MED ORDER — HEPARIN SODIUM (PORCINE) 1000 UNIT/ML IJ SOLN
INTRAMUSCULAR | Status: DC | PRN
  Administered 2016-03-09: 9000 [IU] via INTRAVENOUS

## 2016-03-09 MED ORDER — HYDROMORPHONE HCL 1 MG/ML IJ SOLN
0.2500 mg | INTRAMUSCULAR | Status: DC | PRN
  Administered 2016-03-09 (×4): 0.5 mg via INTRAVENOUS

## 2016-03-09 MED ORDER — MANNITOL 25 % IV SOLN
INTRAVENOUS | Status: DC | PRN
  Administered 2016-03-09 (×2): 12.5 g via INTRAVENOUS

## 2016-03-09 MED ORDER — ORAL CARE MOUTH RINSE
15.0000 mL | Freq: Two times a day (BID) | OROMUCOSAL | Status: DC
  Administered 2016-03-10 – 2016-03-13 (×8): 15 mL via OROMUCOSAL

## 2016-03-09 MED ORDER — LIDOCAINE 2% (20 MG/ML) 5 ML SYRINGE
INTRAMUSCULAR | Status: AC
  Filled 2016-03-09: qty 5

## 2016-03-09 MED ORDER — ROCURONIUM BROMIDE 10 MG/ML (PF) SYRINGE
PREFILLED_SYRINGE | INTRAVENOUS | Status: AC
  Filled 2016-03-09: qty 10

## 2016-03-09 MED ORDER — BISACODYL 10 MG RE SUPP: 10.0000 mg | Freq: Every day | RECTAL | Status: DC | PRN

## 2016-03-09 MED ORDER — EPHEDRINE SULFATE 50 MG/ML IJ SOLN
INTRAMUSCULAR | Status: DC | PRN
  Administered 2016-03-09 (×4): 10 mg via INTRAVENOUS

## 2016-03-09 MED ORDER — CHLORHEXIDINE GLUCONATE 0.12 % MT SOLN
15.0000 mL | Freq: Two times a day (BID) | OROMUCOSAL | Status: DC
  Administered 2016-03-09 – 2016-03-13 (×9): 15 mL via OROMUCOSAL
  Filled 2016-03-09 (×7): qty 15

## 2016-03-09 MED ORDER — PANTOPRAZOLE SODIUM 40 MG PO TBEC: 40.0000 mg | DELAYED_RELEASE_TABLET | Freq: Every day | ORAL | Status: DC

## 2016-03-09 MED ORDER — HYDRALAZINE HCL 20 MG/ML IJ SOLN: 5.0000 mg | INTRAMUSCULAR | Status: DC | PRN

## 2016-03-09 MED ORDER — ESMOLOL HCL 100 MG/10ML IV SOLN
INTRAVENOUS | Status: AC
  Filled 2016-03-09: qty 10

## 2016-03-09 MED ORDER — PROPOFOL 10 MG/ML IV BOLUS
INTRAVENOUS | Status: AC
  Filled 2016-03-09: qty 20

## 2016-03-09 MED ORDER — EPHEDRINE 5 MG/ML INJ
INTRAVENOUS | Status: AC
  Filled 2016-03-09: qty 10

## 2016-03-09 MED ORDER — ONDANSETRON HCL 4 MG/2ML IJ SOLN
INTRAMUSCULAR | Status: AC
  Filled 2016-03-09: qty 2

## 2016-03-09 MED ORDER — HEPARIN SODIUM (PORCINE) 1000 UNIT/ML IJ SOLN
INTRAMUSCULAR | Status: AC
  Filled 2016-03-09: qty 1

## 2016-03-09 MED ORDER — MIDAZOLAM HCL 2 MG/2ML IJ SOLN
INTRAMUSCULAR | Status: AC
  Filled 2016-03-09: qty 2

## 2016-03-09 MED ORDER — PROTAMINE SULFATE 10 MG/ML IV SOLN
INTRAVENOUS | Status: DC | PRN
  Administered 2016-03-09: 10 mg via INTRAVENOUS
  Administered 2016-03-09 (×2): 20 mg via INTRAVENOUS

## 2016-03-09 MED ORDER — FENTANYL CITRATE (PF) 100 MCG/2ML IJ SOLN
INTRAMUSCULAR | Status: AC
  Filled 2016-03-09: qty 4

## 2016-03-09 MED ORDER — SODIUM CHLORIDE 0.9 % IV SOLN: INTRAVENOUS | Status: DC

## 2016-03-09 MED ORDER — KCL IN DEXTROSE-NACL 20-5-0.45 MEQ/L-%-% IV SOLN
INTRAVENOUS | Status: DC
  Administered 2016-03-09 – 2016-03-11 (×5): via INTRAVENOUS
  Filled 2016-03-09 (×8): qty 1000

## 2016-03-09 MED ORDER — OXYCODONE HCL 5 MG PO TABS: 5.0000 mg | ORAL_TABLET | Freq: Once | ORAL | Status: DC | PRN

## 2016-03-09 MED ORDER — SUGAMMADEX SODIUM 200 MG/2ML IV SOLN
INTRAVENOUS | Status: DC | PRN
  Administered 2016-03-09: 200 mg via INTRAVENOUS

## 2016-03-09 MED ORDER — 0.9 % SODIUM CHLORIDE (POUR BTL) OPTIME
TOPICAL | Status: DC | PRN
  Administered 2016-03-09: 3000 mL

## 2016-03-09 MED ORDER — PHENYLEPHRINE HCL 10 MG/ML IJ SOLN
INTRAMUSCULAR | Status: DC | PRN
  Administered 2016-03-09 (×2): 80 ug via INTRAVENOUS

## 2016-03-09 MED ORDER — ESMOLOL HCL 100 MG/10ML IV SOLN
INTRAVENOUS | Status: DC | PRN
  Administered 2016-03-09: 30 mg via INTRAVENOUS
  Administered 2016-03-09: 20 mg via INTRAVENOUS

## 2016-03-09 MED ORDER — ACETAMINOPHEN 325 MG PO TABS
325.0000 mg | ORAL_TABLET | ORAL | Status: DC | PRN
  Administered 2016-03-10: 650 mg via ORAL
  Filled 2016-03-09: qty 2

## 2016-03-09 MED ORDER — PANTOPRAZOLE SODIUM 40 MG IV SOLR
40.0000 mg | INTRAVENOUS | Status: DC
  Administered 2016-03-09 – 2016-03-12 (×4): 40 mg via INTRAVENOUS
  Filled 2016-03-09 (×4): qty 40

## 2016-03-09 MED ORDER — LACTATED RINGERS IV SOLN
INTRAVENOUS | Status: DC | PRN
  Administered 2016-03-09 (×2): via INTRAVENOUS

## 2016-03-09 MED FILL — Sodium Chloride IV Soln 0.9%: INTRAVENOUS | Qty: 1000 | Status: AC

## 2016-03-09 MED FILL — Heparin Sodium (Porcine) Inj 1000 Unit/ML: INTRAMUSCULAR | Qty: 30 | Status: AC

## 2016-03-09 SURGICAL SUPPLY — 41 items
BENZOIN TINCTURE PRP APPL 2/3 (GAUZE/BANDAGES/DRESSINGS) ×3 IMPLANT
CANISTER SUCTION 2500CC (MISCELLANEOUS) ×3 IMPLANT
CANNULA VESSEL 3MM 2 BLNT TIP (CANNULA) ×6 IMPLANT
CLIP LIGATING EXTRA MED SLVR (CLIP) ×3 IMPLANT
CLIP LIGATING EXTRA SM BLUE (MISCELLANEOUS) ×3 IMPLANT
CLOSURE STERI-STRIP 1/2X4 (GAUZE/BANDAGES/DRESSINGS) ×1
CLSR STERI-STRIP ANTIMIC 1/2X4 (GAUZE/BANDAGES/DRESSINGS) ×2 IMPLANT
DRSG COVADERM 4X14 (GAUZE/BANDAGES/DRESSINGS) ×3 IMPLANT
ELECT BLADE 4.0 EZ CLEAN MEGAD (MISCELLANEOUS)
ELECT REM PT RETURN 9FT ADLT (ELECTROSURGICAL) ×3
ELECTRODE BLDE 4.0 EZ CLN MEGD (MISCELLANEOUS) IMPLANT
ELECTRODE REM PT RTRN 9FT ADLT (ELECTROSURGICAL) ×1 IMPLANT
FELT TEFLON 4 X1 (Mesh General) ×3 IMPLANT
GLOVE SS BIOGEL STRL SZ 7.5 (GLOVE) ×1 IMPLANT
GLOVE SUPERSENSE BIOGEL SZ 7.5 (GLOVE) ×2
GOWN STRL REUS W/ TWL LRG LVL3 (GOWN DISPOSABLE) ×4 IMPLANT
GOWN STRL REUS W/TWL LRG LVL3 (GOWN DISPOSABLE) ×8
GRAFT HEMASHIELD 14MM (Vascular Products) ×3 IMPLANT
INSERT FOGARTY 61MM (MISCELLANEOUS) ×6 IMPLANT
INSERT FOGARTY SM (MISCELLANEOUS) ×9 IMPLANT
KIT BASIN OR (CUSTOM PROCEDURE TRAY) ×3 IMPLANT
KIT ROOM TURNOVER OR (KITS) ×3 IMPLANT
NS IRRIG 1000ML POUR BTL (IV SOLUTION) ×9 IMPLANT
PACK AORTA (CUSTOM PROCEDURE TRAY) ×3 IMPLANT
PAD ARMBOARD 7.5X6 YLW CONV (MISCELLANEOUS) ×6 IMPLANT
SPONGE LAP 18X18 X RAY DECT (DISPOSABLE) IMPLANT
STAPLER VISISTAT 35W (STAPLE) IMPLANT
SUT ETHIBOND 5 LR DA (SUTURE) IMPLANT
SUT PDS AB 1 TP1 54 (SUTURE) ×6 IMPLANT
SUT PROLENE 3 0 SH1 36 (SUTURE) ×6 IMPLANT
SUT PROLENE 5 0 C 1 24 (SUTURE) IMPLANT
SUT PROLENE 5 0 C 1 36 (SUTURE) ×6 IMPLANT
SUT SILK 2 0 SH CR/8 (SUTURE) ×3 IMPLANT
SUT VIC AB 2-0 CT1 27 (SUTURE) ×4
SUT VIC AB 2-0 CT1 36 (SUTURE) IMPLANT
SUT VIC AB 2-0 CT1 TAPERPNT 27 (SUTURE) ×2 IMPLANT
SUT VIC AB 3-0 SH 27 (SUTURE) ×8
SUT VIC AB 3-0 SH 27X BRD (SUTURE) ×4 IMPLANT
TOWEL BLUE STERILE X RAY DET (MISCELLANEOUS) ×6 IMPLANT
TRAY FOLEY W/METER SILVER 16FR (SET/KITS/TRAYS/PACK) ×3 IMPLANT
WATER STERILE IRR 1000ML POUR (IV SOLUTION) ×3 IMPLANT

## 2016-03-09 NOTE — H&P (Signed)
Office Visit   02/04/2016 [facility] -Peter Minium, MD  Vascular Surgery   AAA (abdominal aortic aneurysm) without rupture [facility])  Dx   Re-evaluation   Reason for Visit   Additional Documentation   Vitals:   BP  137/91 (BP Location: Left Arm, Patient Position: Sitting, Cuff Size: Normal)   Pulse 77   Ht 6\' 3"  (1.905 m)   Wt 220 lb 12.8 oz (100.2 kg)   SpO2 97%   BMI 27.60 kg/m   BSA 2.3 m   Flowsheets:   Infectious Disease Screening,   Custom Formula Data,   MEWS Score,   Anthropometrics     Encounter Info:   Billing Info,   History,   Allergies,   Detailed Report     All Notes   Progress Notes by Larina Earthly, MD at 02/04/2016 9:15 AM   Author: Larina Earthly, MD Author Type: Physician Filed: 02/04/2016 9:42 AM  Note Status: Signed Cosign: Cosign Not Required Encounter Date: 02/04/2016  Editor: Larina Earthly, MD (Physician)      Patient name: Kyle Luna            MRN: [id number]        DOB: 1956-01-22          Sex: male  REASON FOR VISIT: Follow-up abdominal aortic aneurysm and discuss recent CT  HPI: Kyle Luna is a 60 y.o. male here for continued discussion of his abdominal aortic aneurysm. I'd seen him several weeks ago in initial consultation. He was found also to have a 5.2 cm asymptomatic infrarenal abdominal aortic aneurysm. He underwent CT angiogram of his abdomen and pelvis on 01/31/2016. I have reviewed his actual films and reports and have reviewed his films today with the patient and his wife present. He has no symptoms of his aneurysm and no change in his medical history since her visit several weeks ago. He does have a cardiology appointment for clearance on 02/07/2016  History reviewed. No pertinent past medical history.       Family History  Problem Relation Age of Onset  . Adopted: Yes    SOCIAL HISTORY:        Social History   Substance Use Topics   . Smoking status: Current Every Day Smoker   .  Smokeless tobacco: Never Used      Comment: Less than 1 pk per day.    . Alcohol use 2.4 - 3.0 oz/week    4 - 5 Cans of beer per week    No Known Allergies  No current outpatient prescriptions on file.   No current facility-administered medications for this visit.     REVIEW OF SYSTEMS:  [X]  denotes positive finding, [ ]  denotes negative finding Cardiac  Comments:  Chest pain or chest pressure:    Shortness of breath upon exertion:    Short of breath when lying flat:    Irregular heart rhythm:        Vascular    Pain in calf, thigh, or hip brought on by ambulation:    Pain in feet at night that wakes you up from your sleep:     Blood clot in your veins:    Leg swelling:         Pulmonary    Oxygen at home:    Productive cough:     Wheezing:         Neurologic    Sudden weakness in arms or legs:  Sudden numbness in arms or legs:     Sudden onset of difficulty speaking or slurred speech:    Temporary loss of vision in one eye:     Problems with dizziness:         Gastrointestinal    Blood in stool:     Vomited blood:         Genitourinary    Burning when urinating:     Blood in urine:        Psychiatric    Major depression:         Hematologic    Bleeding problems:    Problems with blood clotting too easily:        Skin    Rashes or ulcers:        Constitutional    Fever or chills:      PHYSICAL EXAM:    Vitals:   02/04/16 0903  BP: (!) 137/91  Pulse: 77  SpO2: 97%  Weight: 220 lb 12.8 oz (100.2 kg)  Height: 6\' 3"  (1.905 m)    GENERAL: The patient is a well-nourished male, in no acute distress. The vital signs are documented above. PULMONARY: There is good air exchange  MUSCULOSKELETAL: There are no major deformities or cyanosis. NEUROLOGIC: No focal weakness or paresthesias are detected. SKIN: There are no ulcers or rashes  noted. PSYCHIATRIC: The patient has a normal affect.  DATA:   CT scan reveals an infrarenal abdominal aortic aneurysm with maximal diameter 5.3 cm. He does have tension down to the iliac arteries but does not extend into the iliac arteries. He does have good caliber iliac vessels for potential delivery of the stent graft. 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  MEDICAL ISSUES:  Again had long discussion with the patient and his wife regarding the options of stent graft repair and open aneurysm repair. Did explain the potential for Endo leaks with stent graft repair and the requirement for ongoing lifelong surveillance. I will review his CT scan with the device manufacturer. Explained would in all likelihood require coverage of his lower pole accessory renal arteries but this typically does not cause any untoward events. Will make final recommendation following cardiac clearance and after review of the CT scan. He wishes to proceed as soon as possible    Krystal Teachey [facility] [place] [facility] [phone]      Instructions    After Visit Summary (Printed 02/04/2016)  Communications      [facility] Provider CC Chart Rep sent to Lewis MoccasinElizabeth R Dewey, MD  Media   Electronic signature on 02/04/2016 8:55 AM   Orders Placed    None  Medication Changes     None    Medication List  Visit Diagnoses      AAA (abdominal aortic aneurysm) without rupture (HCC)    Problem List  Level of Service   Level of Service  PR OFFICE OUTPATIENT VISIT 25 MINUTES [99214]  All Charges for This Encounter   Code Description Service Date Service Provider Modifiers Qty  99214 PR OFFICE OUTPATIENT VISIT 25 MINUTES 02/04/2016 Larina Earthlyodd F Nazaret Chea, MD  1   Addendum:  The patient has been re-examined and re-evaluated.  The patient's history and physical has been reviewed and is unchanged.    Kyle BogusRobert Luna is a 60 y.o. male is  being admitted with Abdominal aortic aneurysm I71.4. All the risks, benefits and other treatment options have been discussed with the  patient. The patient has consented to proceed with Procedure(s): ANEURYSM ABDOMINAL AORTIC REPAIR as a surgical intervention.  Gretta Began 03/09/2016 7:04 AM [facility]

## 2016-03-09 NOTE — Progress Notes (Signed)
Received call from radiologist regarding need for NGT advancement.  Advanced tube 10cm as recommended.  Placement checked via ascultation.

## 2016-03-09 NOTE — Progress Notes (Signed)
       CC: pain at the abdominal incision  Palpable DP pulses 2+ bilaterally Abdominal incision clean and dry Heart RRR Lungs non labored breathing Labetalol given for hypertension    S/P open AAA repair Disposition stable  Maximiano Lott MAUREEN PA-C

## 2016-03-09 NOTE — Anesthesia Procedure Notes (Signed)
Procedure Name: Intubation Date/Time: 03/09/2016 7:41 AM Performed by: Jed LimerickHARDER, Jerzy Crotteau S Pre-anesthesia Checklist: Patient identified, Emergency Drugs available, Suction available and Patient being monitored Patient Re-evaluated:Patient Re-evaluated prior to inductionOxygen Delivery Method: Circle System Utilized Preoxygenation: Pre-oxygenation with 100% oxygen Intubation Type: IV induction Ventilation: Mask ventilation without difficulty Grade View: Grade II Tube type: Oral Tube size: 7.5 mm Number of attempts: 1 Airway Equipment and Method: Stylet and Oral airway Placement Confirmation: ETT inserted through vocal cords under direct vision,  positive ETCO2 and breath sounds checked- equal and bilateral Secured at: 23 cm Tube secured with: Tape Dental Injury: Teeth and Oropharynx as per pre-operative assessment

## 2016-03-09 NOTE — Anesthesia Procedure Notes (Signed)
Central Venous Catheter Insertion Performed by: anesthesiologist Patient location: Pre-op. Preanesthetic checklist: patient identified, IV checked, site marked, risks and benefits discussed, surgical consent, monitors and equipment checked, pre-op evaluation, timeout performed and anesthesia consent Position: Trendelenburg Lidocaine 1% used for infiltration Landmarks identified Catheter size: 8.5 Fr Central line was placed.Sheath introducer Procedure performed using ultrasound guided technique. Attempts: 1 Following insertion, line sutured and dressing applied. Post procedure assessment: blood return through all ports, free fluid flow and no air. Patient tolerated the procedure well with no immediate complications.       

## 2016-03-09 NOTE — Anesthesia Postprocedure Evaluation (Signed)
Anesthesia Post Note  Patient: Hale BogusRobert Stallsmith  Procedure(s) Performed: Procedure(s) (LRB): ANEURYSM ABDOMINAL AORTIC REPAIR (N/A)  Patient location during evaluation: PACU Anesthesia Type: General Level of consciousness: awake and alert Pain management: pain level controlled Vital Signs Assessment: post-procedure vital signs reviewed and stable Respiratory status: spontaneous breathing, nonlabored ventilation, respiratory function stable and patient connected to nasal cannula oxygen Cardiovascular status: blood pressure returned to baseline and stable Postop Assessment: no signs of nausea or vomiting Anesthetic complications: no    Last Vitals:  Vitals:   03/09/16 1230 03/09/16 1330  BP:    Pulse:    Resp:    Temp: (!) 35.7 C 36.4 C    Last Pain:  Vitals:   03/09/16 1330  TempSrc: Oral  PainSc:                  Reino KentJudd, Mlissa Tamayo J

## 2016-03-09 NOTE — Op Note (Signed)
OPERATIVE REPORT  DATE OF SURGERY: 03/09/2016  PATIENT: Kyle Luna, 60 y.o. male MRN: [id number]  DOB: 1955/05/25  PRE-OPERATIVE DIAGNOSIS: Infrarenal abdominal aortic aneurysm  POST-OPERATIVE DIAGNOSIS:  Same  PROCEDURE: Resection and grafting abdominal aortic aneurysm repair with a 14 mm straight aortic graft  SURGEON:  Gretta Beganodd Izel Hochberg, M.D.  PHYSICIAN ASSISTANT: Collins PA-C  ANESTHESIA:  Gen.  EBL: 200 ml  Total I/O In: 2750 [I.V.:2500; IV Piggyback:250] Out: 535 [Urine:335; Blood:200]  BLOOD ADMINISTERED: None  DRAINS: None  SPECIMEN: None  COUNTS CORRECT:  YES  PLAN OF CARE: PACU extubated   PATIENT DISPOSITION:  PACU - hemodynamically stable  PROCEDURE DETAILS: Patient was taken to the operating placed supine position where the area the abdomen both groins were prepped and draped in usual sterile fashion. Incision was made from the xiphoid to just below the umbilicus and carried down to the midline that with electrocautery. The midline fascia was opened with electrocautery in line with skin incision. The peritoneum was entered. The liver and gallbladder large and small intestine were normal. The NG tube was in appropriate position. The omni-Trac retractor was used for exposure. The transverse colon omentum reflected superiorly. The small bowel was reflected to the right. The duodenum was mobilized off the aorta. The aorta was encircled below the level the renal arteries and was relatively small caliber. The inferior mesenteric artery was small and was patent arteriogram. The intermesenteric artery was ligated with a 2-0 silk tie. This was at its origin from the aorta. Dissection was continued down to the aortic bifurcation. The aorta became normal caliber at the bifurcation and the iliac arteries had no aneurysmal change. There was minimal calcification at this area. The right and left common iliac arteries were exposed for control. The patient was given 25 g of  mannitol and 9000 units of intravenous heparin. After adequate circulation time the aorta was occluded with a Hartmann clamp below the level of the renal arteries. The common iliac arteries were occluded bilaterally with Henley clamps. The aorta was opened longitudinally. Lumbar back bleeding was controlled with 2-0 silk ties. The aorta was transected below the level of the proximal clamp was also transected above the level of the aortic bifurcation. A 14 mm Hemashield graft was brought onto the field. Felt strip was used for reinforcement proximally and distally. 3-0 Prolene suture was used for the proximal anastomosis and this was checked and found to be adequate. After the usual flushing maneuvers the graft was cut to appropriate length and was sewn into into the aorta just above the level of the bifurcation again with a 3-0 Prolene suture in a felt strip reinforcement. The usual flushing maneuvers were undertaken prior to completion of the anastomosis. Anastomosis completed and flow was returned first to the right and then the left leg. Patient was given 50 mg of protamine to reverse the heparin. Wounds irrigated with saline. Hemostasis cautery. The aortic wall was closed over the graft with a running 2-0 Vicryl suture. The retroperitoneum was closed with running 2-0 Vicryl to exclude the duodenum from the graft. The small bowel was run in its entirety and found to be without injury was placed back in the pelvis. Transverse colon omentum replaced over this. The midline fascia was closed with a #1 PDS suture beginning proximally and distally and tying in the middle. The skin was closed with 3-0 subcuticular Vicryl suture. The sterile dressing was applied. The patient had 2+ dorsalis pedis pulses bilaterally and was transferred to  the recovery room in stable condition and extubated.   Larina Earthly, M.D., [facility] 03/09/2016 10:56 AM

## 2016-03-09 NOTE — Transfer of Care (Signed)
Immediate Anesthesia Transfer of Care Note  Patient: Kyle Luna  Procedure(s) Performed: Procedure(s): ANEURYSM ABDOMINAL AORTIC REPAIR (N/A)  Patient Location: PACU  Anesthesia Type:General  Level of Consciousness: awake, alert  and oriented  Airway & Oxygen Therapy: Patient Spontanous Breathing and Patient connected to nasal cannula oxygen  Post-op Assessment: Report given to RN and Post -op Vital signs reviewed and stable  Post vital signs: Reviewed and stable  Last Vitals:  Vitals:   03/09/16 0720 03/09/16 1040  BP:  126/79  Pulse: 63 71  Resp: (!) 26 12  Temp:      Last Pain:  Vitals:   03/09/16 0646  TempSrc: Oral      Patients Stated Pain Goal: 3 (03/09/16 0646)  Complications: No apparent anesthesia complications

## 2016-03-10 ENCOUNTER — Encounter (HOSPITAL_COMMUNITY): Payer: Self-pay | Admitting: Vascular Surgery

## 2016-03-10 ENCOUNTER — Inpatient Hospital Stay (HOSPITAL_COMMUNITY): Payer: BLUE CROSS/BLUE SHIELD

## 2016-03-10 LAB — COMPREHENSIVE METABOLIC PANEL
ALK PHOS: 50 U/L (ref 38–126)
ALT: 53 U/L (ref 17–63)
ANION GAP: 7 (ref 5–15)
AST: 36 U/L (ref 15–41)
Albumin: 3.4 g/dL — ABNORMAL LOW (ref 3.5–5.0)
BILIRUBIN TOTAL: 0.9 mg/dL (ref 0.3–1.2)
BUN: 6 mg/dL (ref 6–20)
CALCIUM: 8.7 mg/dL — AB (ref 8.9–10.3)
CO2: 27 mmol/L (ref 22–32)
Chloride: 103 mmol/L (ref 101–111)
Creatinine, Ser: 0.83 mg/dL (ref 0.61–1.24)
GLUCOSE: 120 mg/dL — AB (ref 65–99)
Potassium: 3.6 mmol/L (ref 3.5–5.1)
Sodium: 137 mmol/L (ref 135–145)
TOTAL PROTEIN: 6.2 g/dL — AB (ref 6.5–8.1)

## 2016-03-10 LAB — CBC
HEMATOCRIT: 39.6 % (ref 39.0–52.0)
HEMOGLOBIN: 13.3 g/dL (ref 13.0–17.0)
MCH: 31.1 pg (ref 26.0–34.0)
MCHC: 33.6 g/dL (ref 30.0–36.0)
MCV: 92.7 fL (ref 78.0–100.0)
Platelets: 229 10*3/uL (ref 150–400)
RBC: 4.27 MIL/uL (ref 4.22–5.81)
RDW: 14.6 % (ref 11.5–15.5)
WBC: 11.9 10*3/uL — ABNORMAL HIGH (ref 4.0–10.5)

## 2016-03-10 LAB — MAGNESIUM: MAGNESIUM: 1.7 mg/dL (ref 1.7–2.4)

## 2016-03-10 LAB — AMYLASE: AMYLASE: 46 U/L (ref 28–100)

## 2016-03-10 MED ORDER — DIAZEPAM 5 MG/ML IJ SOLN
2.5000 mg | Freq: Once | INTRAMUSCULAR | Status: AC
  Administered 2016-03-10: 2.5 mg via INTRAVENOUS
  Filled 2016-03-10: qty 2

## 2016-03-10 MED ORDER — SODIUM CHLORIDE 0.9% FLUSH: 9.0000 mL | INTRAVENOUS | Status: DC | PRN

## 2016-03-10 MED ORDER — DIPHENHYDRAMINE HCL 12.5 MG/5ML PO ELIX: 12.5000 mg | ORAL_SOLUTION | Freq: Four times a day (QID) | ORAL | Status: DC | PRN

## 2016-03-10 MED ORDER — HYDROMORPHONE 1 MG/ML IV SOLN
INTRAVENOUS | Status: DC
  Administered 2016-03-10: 3.3 mg via INTRAVENOUS
  Administered 2016-03-10: 08:00:00 via INTRAVENOUS
  Administered 2016-03-10: 1.8 mg via INTRAVENOUS
  Administered 2016-03-10: 2.7 mg via INTRAVENOUS
  Administered 2016-03-11: 2.8 mg via INTRAVENOUS
  Administered 2016-03-11: 1.8 mg via INTRAVENOUS
  Administered 2016-03-11: 15:00:00 via INTRAVENOUS
  Administered 2016-03-11: 3 mg via INTRAVENOUS
  Administered 2016-03-11: 5.4 mg via INTRAVENOUS
  Administered 2016-03-11: 2.7 mg via INTRAVENOUS
  Administered 2016-03-12: 0 mg via INTRAVENOUS
  Filled 2016-03-10 (×2): qty 25

## 2016-03-10 MED ORDER — NALOXONE HCL 0.4 MG/ML IJ SOLN: 0.4000 mg | INTRAMUSCULAR | Status: DC | PRN

## 2016-03-10 MED ORDER — POTASSIUM CHLORIDE 10 MEQ/50ML IV SOLN
10.0000 meq | INTRAVENOUS | Status: AC
Start: 2016-03-10 — End: 2016-03-10
  Administered 2016-03-10 (×2): 10 meq via INTRAVENOUS
  Filled 2016-03-10 (×2): qty 50

## 2016-03-10 MED ORDER — DIPHENHYDRAMINE HCL 50 MG/ML IJ SOLN: 12.5000 mg | Freq: Four times a day (QID) | INTRAMUSCULAR | Status: DC | PRN

## 2016-03-10 NOTE — Evaluation (Signed)
Physical Therapy Evaluation Patient Details Name: Kyle Luna MRN: [id number] DOB: March 23, 1956 Today's Date: 03/10/2016   History of Present Illness  60 yo admitted for AAA repair. PMHx smoker and ETOH use  Clinical Impression  Pt pleasant and limited by pain in abdomen with pt reporting increased tolerance now that he has a PCA and utilized during session. Pt normally active, independent and working with plans to return to all above. Pt with decreased transfers, gait and function who will benefit from acute therapy to maximize independence, endurance and gait. Recommend daily activity and ambulation with nursing assist.  HR 85 97% on RA 128/72 before gait and 116/70 after    Follow Up Recommendations Home health PT;Supervision for mobility/OOB    Equipment Recommendations  Rolling walker with 5" wheels;3in1 (PT)    Recommendations for Other Services OT consult     Precautions / Restrictions Precautions Precautions: Fall      Mobility  Bed Mobility Overal bed mobility: Needs Assistance Bed Mobility: Rolling;Sidelying to Sit Rolling: Min assist Sidelying to sit: Min assist       General bed mobility comments: cues for sequence with use of pillow to splint abdomen, assist to rotate trunk and elevate it from surface  Transfers Overall transfer level: Needs assistance   Transfers: Sit to/from Stand Sit to Stand: Min assist         General transfer comment: cues for hand placement with assist for anterior translation and rise  Ambulation/Gait Ambulation/Gait assistance: Min assist;+2 safety/equipment Ambulation Distance (Feet): 35 Feet Assistive device: Rolling walker (2 wheeled) Gait Pattern/deviations: Step-through pattern;Decreased stride length;Trunk flexed   Gait velocity interpretation: Below normal speed for age/gender General Gait Details: cues for posture, position in RW and safety with chair to follow due to fatigue  Stairs             Wheelchair Mobility    Modified Rankin (Stroke Patients Only)       Balance Overall balance assessment: Needs assistance   Sitting balance-Leahy Scale: Good       Standing balance-Leahy Scale: Fair                               Pertinent Vitals/Pain Pain Assessment: 0-10 Pain Score: 6  Pain Location: abdomen Pain Descriptors / Indicators: Sore;Operative site guarding Pain Intervention(s): Limited activity within patient's tolerance;Monitored during session;Premedicated before session;PCA encouraged;Repositioned    Home Living Family/patient expects to be discharged to:: Private residence Living Arrangements: Spouse/significant other Available Help at Discharge: Family;Available 24 hours/day Type of Home: House Home Access: Stairs to enter Entrance Stairs-Rails: Doctor, general practice of Steps: 5 Home Layout: Two level;Able to live on main level with bedroom/bathroom Home Equipment: Shower seat - built in;Hand held shower head      Prior Function Level of Independence: Independent         Comments: works for [employer], likes to Metallurgist        Extremity/Trunk Assessment   Upper Extremity Assessment: Overall WFL for tasks assessed           Lower Extremity Assessment: Overall WFL for tasks assessed      Cervical / Trunk Assessment: Kyphotic  Communication   Communication: No difficulties  Cognition Arousal/Alertness: Awake/alert Behavior During Therapy: WFL for tasks assessed/performed Overall Cognitive Status: Within Functional Limits for tasks assessed  General Comments      Exercises     Assessment/Plan    PT Assessment Patient needs continued PT services  PT Problem List Decreased mobility;Decreased activity tolerance;Decreased knowledge of use of DME;Decreased balance          PT Treatment Interventions DME instruction;Gait training;Stair  training;Functional mobility training;Therapeutic exercise;Patient/family education;Therapeutic activities    PT Goals (Current goals can be found in the Care Plan section)  Acute Rehab PT Goals Patient Stated Goal: return to work and golf PT Goal Formulation: With patient Time For Goal Achievement: 03/24/16 Potential to Achieve Goals: Good    Frequency Min 3X/week   Barriers to discharge        Co-evaluation               End of Session Equipment Utilized During Treatment: Gait belt Activity Tolerance: Patient tolerated treatment well Patient left: in chair;with call bell/phone within reach Nurse Communication: Mobility status;Precautions         Time: [id number] PT Time Calculation (min) (ACUTE ONLY): 26 min   Charges:   PT Evaluation $PT Eval Moderate Complexity: 1 Procedure PT Treatments $Therapeutic Activity: 8-22 mins   PT G CodesDelorse Lek:        Tabor, Lakea Mittelman Beth 03/10/2016, 12:04 PM  Delaney MeigsMaija Tabor Alfhild Partch, PT [phone]

## 2016-03-10 NOTE — Progress Notes (Signed)
Patient requesting medication for sleep, contacted PA Collins received an order for Valium.  Will continue to monitor.

## 2016-03-10 NOTE — Progress Notes (Signed)
OT Cancellation Note  Patient Details Name: Kyle BogusRobert Wadding MRN: [id number] DOB: [birth year]   Cancelled Treatment:    Reason Eval/Treat Not Completed: Other (comment) (Pt transferring unit).  Will reattempt.  Ayman Brull Hernandoonarpe, OTR/L [phone]   Jeani HawkingConarpe, Erik Nessel M 03/10/2016, 3:20 PM

## 2016-03-10 NOTE — Progress Notes (Signed)
[facility] [place]  Subjective  - Pain control is not adequate.    Objective 126/76 84 99.1 F (37.3 C) (Oral) 18 95%  Intake/Output Summary (Last 24 hours) at 03/10/16 0719 Last data filed at 03/10/16 0600  Gross per 24 hour  Intake          5231.67 ml  Output             2505 ml  Net          2726.67 ml    Abdominal incisional dressing with minimal spotting. Palpable DP 2+ pulses bilaterally Heart RRR Lungs non labored breathing   Assessment/Planning: POD # 1 open AAA NG output 300 cc last 12 hour shift with flushes NG D/C Start Dilaudid PCA Transfer to Kyle Horseman3S   Kyle Luna [facility] 03/10/2016 7:19 AM --  Laboratory Lab Results:  Recent Labs  03/09/16 1337 03/10/16 0345  WBC 14.3* 11.9*  HGB 13.8 13.3  HCT 39.9 39.6  PLT 239 229   BMET  Recent Labs  03/09/16 1036 03/10/16 0345  NA 139 137  K 3.9 3.6  CL 107 103  CO2 25 27  GLUCOSE 137* 120*  BUN 12 6  CREATININE 0.87 0.83  CALCIUM 9.0 8.7*    COAG Lab Results  Component Value Date   INR 1.04 03/09/2016   INR 0.95 03/02/2016   No results found for: PTT

## 2016-03-10 NOTE — Progress Notes (Signed)
Patient transferred to 3S12; Report given to Wende CreaseBarbara J Grogan.  Patient belongings transferred with patient and given to RN.  Patient chart received by Nurse Secretary.

## 2016-03-11 MED ORDER — DIAZEPAM 5 MG PO TABS
2.5000 mg | ORAL_TABLET | Freq: Once | ORAL | Status: AC
  Administered 2016-03-11: 2.5 mg via ORAL
  Filled 2016-03-11: qty 1

## 2016-03-11 NOTE — Care Management Note (Addendum)
Case Management Note  Patient Details  Name: Kyle Luna MRN: [id number] Date of Birth: [birth year]  Subjective/Objective:   S/p AAA repair, lives with wife at home, per pt eval recs hhpt and rolling walker, patient states [facility] can provide rolling walker and he would like for his wife to look over [facility].  NCM will check back with him later today.  He has a pcp, and insurance and [facility] at [employer].  NCM will follow up with agency choice for hhpt, list left with patient.  1645- Letha Capeeborah Camil Hausmann RN CM- went back to check to see if patient had chosen an agency, he states his wife will not be here until 7 pm tonight.  NCM will check back tomorrow.  Action/Plan:   Expected Discharge Date:                  Expected Discharge Plan:  Home w Home Health Services  In-House Referral:     Discharge planning Services  CM Consult  Post Acute Care Choice:  Home Health Choice offered to:  Patient  DME Arranged:    DME Agency:     HH Arranged:    HH Agency:     Status of Service:  In process, will continue to follow  If discussed at Long Length of Stay Meetings, dates discussed:    Additional Comments: 10/26 10:00 Letha Capeeborah Corneshia Hines RN CM - NCM spoke with patient again today [facility] , states his wife will be in later and they will decide today when she comes.  He will need HHPT and a rolling walker, and pt also recommended a 3 n 1, not sure if he wants 3 n 1.        Kyle Luna, Kyle Maahs [place], RN 03/11/2016, 12:40 PM

## 2016-03-11 NOTE — Progress Notes (Signed)
@  2330 this RN entered pt's room to follow-up regarding the previous RN's interaction (as noted) with pt and assess pt. Pt endorsed that he just wanted to sleep and promptly refused assessment and vitals. Pt also requested that the noise level outside of his room be reduced. This RN encouraged pt to utilize the call bell if/when he needed any assistance and that I would try to ensure the noise level outside of pt's room was reduced. At this time IVF and PCA remain d/c'd at pt's request. Pt appears to be resting comfortably and in no acute distress. Will wait to convey to MD pt's refusal of the above until the AM unless pt requires additional pain medication or his disposition changes. Will continue to monitor.

## 2016-03-11 NOTE — Evaluation (Signed)
Occupational Therapy Evaluation Patient Details Name: Jaqua Ching MRN: [id number] DOB: 03/18/1956 Today's Date: 03/11/2016    History of Present Illness 60 yo admitted for AAA repair. PMHx smoker and ETOH use   Clinical Impression   Pt was independent prior to admission. Presents with post operative pain, generalized weakness and impaired standing balance interfering with ability to perform ADL and ADL transfers. Pt reporting wife will be home with him and assist with care as needed, is not interested in AE. Pt has a toilet riser at home. Will follow acutely. Do not anticipate need for post acute OT.    Follow Up Recommendations  No OT follow up    Equipment Recommendations  None recommended by OT    Recommendations for Other Services       Precautions / Restrictions Precautions Precautions: Fall Restrictions Weight Bearing Restrictions: No      Mobility Bed Mobility               General bed mobility comments: in chair  Transfers Overall transfer level: Needs assistance Equipment used: Rolling walker (2 wheeled) Transfers: Sit to/from [employer] Sit to Stand: Min assist Stand pivot transfers: Min assist       General transfer comment: cues for hand placement with assist for anterior translation and rise    Balance                                            ADL Overall ADL's : Needs assistance/impaired Eating/Feeding: Independent;Sitting (ice chips with spoon)   Grooming: Wash/dry hands;Wash/dry face;Sitting;Set up   Upper Body Bathing: Minimal assitance;Sitting Upper Body Bathing Details (indicate cue type and reason): assist for back Lower Body Bathing: Maximal assistance;Sit to/from stand   Upper Body Dressing : Minimal assistance;Sitting   Lower Body Dressing: Maximal assistance;Sit to/from stand   Toilet Transfer: Minimal assistance;RW;Stand-pivot   Toileting- Clothing Manipulation and Hygiene:  Maximal assistance       Functional mobility during ADLs: Minimal assistance;Rolling walker General ADL Comments: pain prevents pt from being able to perform LB ADL, will rely on his wife, does not want AE     Vision Additional Comments: glasses are at home   Perception     Praxis      Pertinent Vitals/Pain Pain Assessment: Faces Faces Pain Scale: Hurts even more Pain Location: abdomen Pain Descriptors / Indicators: Aching;Guarding;Grimacing Pain Intervention(s): Monitored during session;Repositioned;Limited activity within patient's tolerance;PCA encouraged     Hand Dominance Right   Extremity/Trunk Assessment Upper Extremity Assessment Upper Extremity Assessment: Overall WFL for tasks assessed   Lower Extremity Assessment Lower Extremity Assessment: Defer to PT evaluation       Communication Communication Communication: No difficulties   Cognition Arousal/Alertness: Awake/alert Behavior During Therapy: WFL for tasks assessed/performed Overall Cognitive Status: Within Functional Limits for tasks assessed                     General Comments       Exercises       Shoulder Instructions      Home Living Family/patient expects to be discharged to:: Private residence Living Arrangements: Spouse/significant other Available Help at Discharge: Family;Available 24 hours/day Type of Home: House Home Access: Stairs to enter [employer] of Steps: 5 Entrance Stairs-Rails: Right;Left Home Layout: Two level;Able to live on main level with bedroom/bathroom     Bathroom  Shower/Tub: Producer, television/film/videoWalk-in shower   Bathroom Toilet: Standard     Home Equipment: Shower seat - built in;Hand held shower head;Toilet riser   Additional Comments: reports having bad knees      Prior Functioning/Environment Level of Independence: Independent        Comments: works for [employer] properties, likes to golf        OT Problem List: Decreased strength;Decreased  activity tolerance;Impaired balance (sitting and/or standing);Decreased knowledge of use of DME or AE;Pain   OT Treatment/Interventions: Self-care/ADL training;DME and/or AE instruction;Patient/family education;Therapeutic activities    OT Goals(Current goals can be found in the care plan section) Acute Rehab OT Goals Patient Stated Goal: return to work and golf OT Goal Formulation: With patient Time For Goal Achievement: 03/18/16 Potential to Achieve Goals: Good ADL Goals Pt Will Perform Grooming: with modified independence;standing Pt Will Perform Upper Body Bathing: with modified independence;sitting Pt Will Perform Lower Body Bathing: with min assist;sit to/from stand;with caregiver independent in assisting Pt Will Perform Upper Body Dressing: with modified independence;sitting Pt Will Perform Lower Body Dressing: with min assist;with caregiver independent in assisting;sit to/from stand Pt Will Transfer to Toilet: with modified independence;ambulating;bedside commode (over toilet) Pt Will Perform Toileting - Clothing Manipulation and hygiene: with modified independence;sit to/from stand Pt Will Perform Tub/Shower Transfer: ambulating;shower seat;rolling walker;with supervision;with caregiver independent in assisting  OT Frequency: Min 2X/week   Barriers to D/C:            Co-evaluation              End of Session Equipment Utilized During Treatment: Rolling walker  Activity Tolerance: Patient limited by pain Patient left: in chair;with call bell/phone within reach   Time: 0904-0920 OT Time Calculation (min): 16 min Charges:  OT General Charges $OT Visit: 1 Procedure OT Evaluation $OT Eval Moderate Complexity: 1 Procedure G-Codes:    Evern BioMayberry, Marzelle Rutten Lynn 03/11/2016, 1:57 PM  [phone]

## 2016-03-11 NOTE — Progress Notes (Addendum)
[facility]   Subjective  - Sitting up in bedside chair.  States he has passed flatus.   Objective 133/85 83 98.5 F (36.9 C) (Oral) 18 96%  Intake/Output Summary (Last 24 hours) at 03/11/16 0720 Last data filed at 03/11/16 0600  Gross per 24 hour  Intake             2985 ml  Output             1395 ml  Net             1590 ml    Palpable DP pulses 2+ Abdominal incision clean and dry, hypo bowel sounds Heart RRR Lungs non labored breathing  Assessment/Planning: POD # 2 open AAA  D/C foley  D/C introducer Maintain PCA Mobility encouraged Possible to start clear liquids today will discuss with Dr. Arbie CookeyEarly. May wait until tomorrow before he is transferred to 2W.  He is still having quit a bit of pain when he moves and needs a lot of assistance.    Clinton GallantCOLLINS, EMMA [facility] 03/11/2016 7:20 AM --  Laboratory Lab Results:  Recent Labs  03/09/16 1337 03/10/16 0345  WBC 14.3* 11.9*  HGB 13.8 13.3  HCT 39.9 39.6  PLT 239 229   BMET  Recent Labs  03/09/16 1036 03/10/16 0345  NA 139 137  K 3.9 3.6  CL 107 103  CO2 25 27  GLUCOSE 137* 120*  BUN 12 6  CREATININE 0.87 0.83  CALCIUM 9.0 8.7*    COAG Lab Results  Component Value Date   INR 1.04 03/09/2016   INR 0.95 03/02/2016   No results found for: PTT  I have examined the patient, reviewed and agree with above. Will start liquids today. Transfer to the or in a.m. if continues to do well. Discussed with wife present  Gretta BeganEarly, Saudia Smyser, MD 03/11/2016 4:33 PM

## 2016-03-11 NOTE — Progress Notes (Signed)
Entered pt's room to find the pt had pulled apart a couple of channels from the IV and was fighting with the IV tubing. Pt was visibly agitated and demanding that IV be removed. It was explained that the IV couldn't be removed but he could be unhooked from IV fluids as well as the PCA. It was also explained that it was unadvised as he was receiving both hydration and pain management via the IV. Pt still refuses, so IV fluids and PCA disconnected at this time. RN accepting this assignment will follow up with MD.

## 2016-03-12 LAB — GLUCOSE, CAPILLARY: Glucose-Capillary: 98 mg/dL (ref 65–99)

## 2016-03-12 MED ORDER — BISACODYL 10 MG RE SUPP
10.0000 mg | Freq: Once | RECTAL | Status: AC
  Administered 2016-03-12: 10 mg via RECTAL
  Filled 2016-03-12: qty 1

## 2016-03-12 MED ORDER — OXYCODONE-ACETAMINOPHEN 5-325 MG PO TABS
1.0000 | ORAL_TABLET | Freq: Four times a day (QID) | ORAL | Status: DC | PRN
  Administered 2016-03-12: 1 via ORAL
  Administered 2016-03-12 – 2016-03-14 (×7): 2 via ORAL
  Filled 2016-03-12: qty 1
  Filled 2016-03-12 (×7): qty 2

## 2016-03-12 MED ORDER — MORPHINE SULFATE (PF) 2 MG/ML IV SOLN: 1.0000 mg | INTRAVENOUS | Status: DC | PRN

## 2016-03-12 NOTE — Progress Notes (Signed)
Physical Therapy Treatment Patient Details Name: Hale BogusRobert Orebaugh MRN: [id number] DOB: August 02, 1955 Today's Date: 03/12/2016    History of Present Illness 60 yo admitted for AAA repair. PMHx smoker and ETOH use    PT Comments    Patient progressing well towards PT goals. Reports pain is controlled. Pt frustrated about all the lines and alarms going off all night. Improved ambulation distance today. Vitals stable. Demonstrates dyspnea on exertion and impaired endurance. Concerned he has not had a BM yet. Will plan for stair training next session as tolerated. Will follow.  Follow Up Recommendations  Home health PT;Supervision for mobility/OOB     Equipment Recommendations  Rolling walker with 5" wheels;3in1 (PT)    Recommendations for Other Services       Precautions / Restrictions Precautions Precautions: Fall Restrictions Weight Bearing Restrictions: No    Mobility  Bed Mobility               General bed mobility comments: in chair  Transfers Overall transfer level: Needs assistance Equipment used: Rolling walker (2 wheeled) Transfers: Sit to/from Stand   Stand pivot transfers: Min guard       General transfer comment: Min guard for safety. Slow to stand, no LOB or assist needed.  Ambulation/Gait Ambulation/Gait assistance: Min guard Ambulation Distance (Feet): 800 Feet Assistive device: Rolling walker (2 wheeled) Gait Pattern/deviations: Step-through pattern;Decreased stride length;Trunk flexed   Gait velocity interpretation: Below normal speed for age/gender General Gait Details: Slow, steady gait with cues for upright posture. Mild DOE. Sp02 94% on RA, HR 96 bpm.   Stairs            Wheelchair Mobility    Modified Rankin (Stroke Patients Only)       Balance Overall balance assessment: Needs assistance Sitting-balance support: Feet supported;No upper extremity supported Sitting balance-Leahy Scale: Good     Standing balance support:  During functional activity Standing balance-Leahy Scale: Fair                      Cognition Arousal/Alertness: Awake/alert Behavior During Therapy: WFL for tasks assessed/performed Overall Cognitive Status: Within Functional Limits for tasks assessed                      Exercises      General Comments        Pertinent Vitals/Pain Faces Pain Scale: Hurts a little bit Pain Location: abdomen Pain Descriptors / Indicators: Sore;Operative site guarding Pain Intervention(s): Monitored during session;Repositioned;Premedicated before session    Home Living                      Prior Function            PT Goals (current goals can now be found in the care plan section) Progress towards PT goals: Progressing toward goals    Frequency    Min 3X/week      PT Plan Current plan remains appropriate    Co-evaluation             End of Session Equipment Utilized During Treatment: Gait belt Activity Tolerance: Patient tolerated treatment well Patient left: in chair;with call bell/phone within reach     Time: 0927-0942 PT Time Calculation (min) (ACUTE ONLY): 15 min  Charges:  $Gait Training: 8-22 mins                    G Codes:      Mykell Genao A Freda Jaquith  03/12/2016, 9:45 AM Mylo Red, PT, DPT [phone]

## 2016-03-12 NOTE — Progress Notes (Signed)
Pt, while remaining pleasant, is now refusing telemetry. Dr. Randie Heinzain, on-call for Dr. Arbie CookeyEarly MD notified via page @[phone]  regarding pt's disposition. Order received for PRN Percocet to replace PCA. Will continue to monitor and assess.

## 2016-03-12 NOTE — Progress Notes (Signed)
Subjective: Interval History: none.. Comfortable this morning sitting up in chair. Was aggravated regarding the telemetry and IV alarms last night. Would not allow that telemetry to be replaced. Has always been in sinus rhythm. Is passing gas by rectum. Is having some belching as well. No nausea. Is just using liquids  Objective: Vital signs in last 24 hours: Temp:  [97.7 F (36.5 C)-98.6 F (37 C)] 98.6 F (37 C) (10/26 0330) Pulse Rate:  [78-90] 80 (10/26 0330) Resp:  [15-94] 15 (10/25 2330) BP: (132-172)/(74-96) 141/78 (10/26 0351) SpO2:  [15 %-96 %] 93 % (10/26 0330)  Intake/Output from previous day: 10/25 0701 - 10/26 0700 In: 1992.1 [P.O.:240; I.V.:1752.1] Out: 1850 [Urine:1850] Intake/Output this shift: No intake/output data recorded.  Abdomen soft. Incision healing quite nicely.  Lab Results:  Recent Labs  03/09/16 1337 03/10/16 0345  WBC 14.3* 11.9*  HGB 13.8 13.3  HCT 39.9 39.6  PLT 239 229   BMET  Recent Labs  03/09/16 1036 03/10/16 0345  NA 139 137  K 3.9 3.6  CL 107 103  CO2 25 27  GLUCOSE 137* 120*  BUN 12 6  CREATININE 0.87 0.83  CALCIUM 9.0 8.7*    Studies/Results: Dg Chest Port 1 View  Result Date: 03/10/2016 CLINICAL DATA:  Postop from abdominal aortic aneurysm repair. EXAM: PORTABLE CHEST 1 VIEW COMPARISON:  03/09/2016 FINDINGS: Nasogastric tube and right jugular Cordis remain in appropriate position. Low lung volumes and bibasilar subsegmental atelectasis show no significant change. No evidence of pulmonary consolidation or edema. No evidence of pneumothorax or pleural effusion. IMPRESSION: No significant change in low lung volumes and mild bibasilar atelectasis. Electronically Signed   By: Myles RosenthalJohn  Stahl M.D.   On: 03/10/2016 08:59   Dg Chest Port 1 View  Result Date: 03/09/2016 CLINICAL DATA:  Status post aortic aneurysm repair EXAM: PORTABLE CHEST 1 VIEW COMPARISON:  None. FINDINGS: Right internal jugular central venous sheath terminates  in the supraclavicular right neck. Enteric tube enters stomach with the tip not seen on this image. Normal heart size. Aortic atherosclerosis. Otherwise normal mediastinal contour. No pneumothorax. No pleural effusion. Low lung volumes. Bibasilar curvilinear opacities, favor atelectasis. No overt pulmonary edema. IMPRESSION: 1. Support structures as described.  No pneumothorax. 2. Low lung volumes with mild bibasilar atelectasis. 3. Aortic atherosclerosis. Electronically Signed   By: Delbert PhenixJason A Poff M.D.   On: 03/09/2016 11:23   Dg Abd Portable 1v  Result Date: 03/09/2016 CLINICAL DATA:  Abdominal aortic aneurysm repair. EXAM: PORTABLE ABDOMEN - 1 VIEW COMPARISON:  Prior chest x-ray of 03/09/2016.  CT is 01/31/2016 . FINDINGS: Since the prior chest x-ray of today the NG tube appears to been withdrawn to the level of the left hemidiaphragm. Distal repositioning of approximately 10 cm suggested. Soft tissue structures are unremarkable. No bowel distention. No free air. Pelvic calcifications consistent phleboliths. Degenerative changes lumbar spine . Surgical clips noted the abdomen . IMPRESSION: Since the prior chest x-ray of today, the NG tube appears to been withdrawn to the level left hemidiaphragm. Advancement of the NG tube by approximately 10 cm suggested . These results will be called to the ordering clinician or representative by the Radiologist Assistant, and communication documented in the PACS or zVision Dashboard. Electronically Signed   By: Maisie Fushomas  Register   On: 03/09/2016 11:25   Anti-infectives: Anti-infectives    Start     Dose/Rate Route Frequency Ordered Stop   03/09/16 2000  cefUROXime (ZINACEF) 1.5 g in dextrose 5 % 50 mL IVPB  1.5 g 100 mL/hr over 30 Minutes Intravenous Every 12 hours 03/09/16 1153 03/10/16 0826   03/09/16 0626  cefUROXime (ZINACEF) 1.5 g in dextrose 5 % 50 mL IVPB     1.5 g 100 mL/hr over 30 Minutes Intravenous 30 min pre-op 03/09/16 0626 03/09/16 0800       Assessment/Plan: s/p Procedure(s): ANEURYSM ABDOMINAL AORTIC REPAIR (N/A) Table overall. Will transfer to 2 west today. Will give suppository. Slowly advanced diet as he tolerates it.   LOS: 3 days   Emily Massar 03/12/2016, 7:09 AM

## 2016-03-12 NOTE — Progress Notes (Signed)
Patient to transfer to 2W08 report given to receiving nurse Marchelle Folks, all questions answered at this time.  Pt. VSS with no s/s of distress noted.  Patient stable at transfer.

## 2016-03-13 MED ORDER — ASPIRIN EC 81 MG PO TBEC: 81.0000 mg | DELAYED_RELEASE_TABLET | Freq: Every day | ORAL | Status: AC

## 2016-03-13 MED ORDER — PANTOPRAZOLE SODIUM 40 MG PO TBEC
40.0000 mg | DELAYED_RELEASE_TABLET | Freq: Every day | ORAL | Status: DC
  Administered 2016-03-13 – 2016-03-14 (×2): 40 mg via ORAL
  Filled 2016-03-13 (×2): qty 1

## 2016-03-13 MED ORDER — OXYCODONE-ACETAMINOPHEN 5-325 MG PO TABS: 1.0000 | ORAL_TABLET | Freq: Four times a day (QID) | ORAL | 0 refills | Status: DC | PRN

## 2016-03-13 MED ORDER — ATORVASTATIN CALCIUM 10 MG PO TABS: 10.0000 mg | ORAL_TABLET | Freq: Every day | ORAL | 11 refills | Status: AC

## 2016-03-13 NOTE — Progress Notes (Signed)
Physical Therapy Treatment Patient Details Name: Kyle Luna MRN: [id number] DOB: 1956/01/24 Today's Date: 03/13/2016    History of Present Illness 60 yo admitted for AAA repair. PMHx smoker and ETOH use    PT Comments    Pt is up walking and on stairs, wife joins him in the room after steps trip.  He is supervision level with walks and stairs require hands on assist, and will continue with him acutely due to this safety goal.  Will be expecting transition home when MD releases him.  Follow Up Recommendations  Home health PT;Supervision for mobility/OOB     Equipment Recommendations  Rolling walker with 5" wheels;3in1 (PT)    Recommendations for Other Services       Precautions / Restrictions Precautions Precautions: Fall (telemetry) Precaution Comments: avoid belt around waist Restrictions Weight Bearing Restrictions: No    Mobility  Bed Mobility               General bed mobility comments: in chair  Transfers Overall transfer level: Needs assistance Equipment used: Rolling walker (2 wheeled) Transfers: Sit to/from BJ'sStand;Stand Pivot Transfers Sit to Stand: Supervision Stand pivot transfers: Supervision          Ambulation/Gait Ambulation/Gait assistance: Supervision;Min guard Ambulation Distance (Feet): 350 Feet Assistive device: Rolling walker (2 wheeled) Gait Pattern/deviations: Step-through pattern;Wide base of support;Trunk flexed Gait velocity: reduced Gait velocity interpretation: Below normal speed for age/gender General Gait Details: cued posture minimally and pt demonstrates controlled speed   Stairs Stairs: Yes Stairs assistance: Min guard Stair Management: One rail Right Number of Stairs: 10 General stair comments: Pt is fatigued by end of stairs and needs a brief standing rest on bottom step  Wheelchair Mobility    Modified Rankin (Stroke Patients Only)       Balance Overall balance assessment: Needs  assistance Sitting-balance support: Feet supported Sitting balance-Leahy Scale: Good   Postural control: Posterior lean Standing balance support: Bilateral upper extremity supported Standing balance-Leahy Scale: Fair                      Cognition Arousal/Alertness: Awake/alert Behavior During Therapy: WFL for tasks assessed/performed Overall Cognitive Status: Within Functional Limits for tasks assessed                      Exercises      General Comments        Pertinent Vitals/Pain Pain Assessment: Faces Faces Pain Scale: Hurts little more Pain Location: abdomen surgery site Pain Descriptors / Indicators: Operative site guarding Pain Intervention(s): Limited activity within patient's tolerance;Premedicated before session;Monitored during session;Repositioned    Home Living                      Prior Function            PT Goals (current goals can now be found in the care plan section) Acute Rehab PT Goals Patient Stated Goal: return to work when medical leave ends Progress towards PT goals: Progressing toward goals    Frequency    Min 3X/week      PT Plan Current plan remains appropriate    Co-evaluation             End of Session Equipment Utilized During Treatment: Gait belt Activity Tolerance: Patient tolerated treatment well Patient left: in chair;with call bell/phone within reach;with chair alarm set     Time: [id number] PT Time Calculation (min) (ACUTE ONLY): 13 min  Charges:  $Gait Training: 8-22 mins                    G Codes:      Ivar Drape 04/02/16, 11:45 AM    Samul Dada, PT MS Acute Rehab Dept. Number: [facility] [id number] and [facility] [phone]

## 2016-03-13 NOTE — Care Management Note (Signed)
Case Management Note Previous CM note initiated by Kyle Luna, Kyle Clinton, Kyle Luna-03/11/2016, 12:40 PM    Patient Details  Name: Kyle Luna MRN: [id number] Date of Birth: [birth year]  Subjective/Objective:   S/p AAA repair, lives with wife at home, per pt eval recs hhpt and rolling walker, patient states [facility] can provide rolling walker and he would like for his wife to look [place] [facility] [place] [facility].  NCM will check back with him later today.  He has a pcp, and insurance and [facility] at [employer].  NCM will follow up with agency choice for hhpt, list left with patient.  1645- Kyle Capeeborah Taylor Kyle Luna CM- went back to check to see if patient had chosen an agency, he states his wife will not be here until 7 pm tonight.  NCM will check back tomorrow.  Action/Plan: Pt tx from 3S to 2W on 10/16  Expected Discharge Date:                  Expected Discharge Plan:  Home w Home Health Services  In-House Referral:     Discharge planning Services  CM Consult  Post Acute Care Choice:  Home Health, Durable Medical Equipment Choice offered to:  Patient  DME Arranged:    DME Agency:     HH Arranged:  PT HH Agency:     Status of Service:  In process, will continue to follow  If discussed at Long Length of Stay Meetings, dates discussed:    Additional Comments:  03/13/16- 1130- Kyle RadonKristi Emileo Semel Kyle Luna, BSN- CM spoke with pt at bedside to f/u with pt on [facility]- per pt his wife has already gone and [facility]- will not need that DME delivered to room- pt also states that he is unsure if he will need HHPT- order has been placed- informed pt that if he changes his mind to please let bedside Kyle Luna know to contact CM so that [facility] made. At this time no referral has been made for HHPT   10/26 10:00 Kyle Capeeborah Taylor Kyle Luna CM - NCM spoke with patient again today about [facility] , states his wife will be in later and they  will decide today when she comes.  He will need HHPT and a rolling walker, and pt also recommended a 3 n 1, not sure if he wants 3 n 1.        Kyle Luna, HeyworthKristi Hall, Kyle Luna 03/13/2016, 1:58 PM [phone]

## 2016-03-13 NOTE — Progress Notes (Addendum)
  [facility] Progress Note  Subjective    Abdomen feels better. Had solid food last night. Tolerated it but didn't eat very much. Is hungry this am. Had very small BM yesterday. Doesn't want another suppository.  Objective Vitals:   03/12/16 2044 03/13/16 0502  BP: 130/72 135/73  Pulse: 64 64  Resp: 16 16  Temp: 98.5 F (36.9 C) 98.4 F (36.9 C)    Intake/Output Summary (Last 24 hours) at 03/13/16 0806 Last data filed at 03/13/16 0600  Gross per 24 hour  Intake             1080 ml  Output              535 ml  Net              545 ml   Abdomen soft and nontender.  Midline incision c/d/i Palpable DP pulses bilaterally   Assessment/Planning: 60 y.o. male is s/p: open AAA repair 4 Days Post-Op   Tolerating regular food well. Small BM yesterday. He doesn't want another suppository.  Pain well controlled on po pain meds. Ambulating well with walker. Will need stair training.  Patient may shower. Patient wants to stay another day. Plan d/c home tomorrow.   Raymond GurneyKimberly A Shawntelle Ungar 03/13/2016 8:06 AM --  Laboratory CBC    Component Value Date/Time   WBC 11.9 (H) 03/10/2016 0345   HGB 13.3 03/10/2016 0345   HCT 39.6 03/10/2016 0345   PLT 229 03/10/2016 0345    BMET    Component Value Date/Time   NA 137 03/10/2016 0345   K 3.6 03/10/2016 0345   CL 103 03/10/2016 0345   CO2 27 03/10/2016 0345   GLUCOSE 120 (H) 03/10/2016 0345   BUN 6 03/10/2016 0345   CREATININE 0.83 03/10/2016 0345   CALCIUM 8.7 (L) 03/10/2016 0345   GFRNONAA >60 03/10/2016 0345   GFRAA >60 03/10/2016 0345    COAG Lab Results  Component Value Date   INR 1.04 03/09/2016   INR 0.95 03/02/2016   No results found for: PTT  Antibiotics Anti-infectives    Start     Dose/Rate Route Frequency Ordered Stop   03/09/16 2000  cefUROXime (ZINACEF) 1.5 g in dextrose 5 % 50 mL IVPB     1.5 g 100 mL/hr over 30 Minutes Intravenous Every 12 hours 03/09/16 1153 03/10/16 0826   03/09/16  0626  cefUROXime (ZINACEF) 1.5 g in dextrose 5 % 50 mL IVPB     1.5 g 100 mL/hr over 30 Minutes Intravenous 30 min pre-op 03/09/16 0626 03/09/16 0800       Maris BergerKimberly Jaycee Pelzer, PA-C [facility] Office: [phone] Pager: [phone] 03/13/2016 8:06 AM

## 2016-03-13 NOTE — Progress Notes (Signed)
Occupational Therapy Treatment and Discharge.  Patient Details Name: Kyle Luna MRN: [id number] DOB: [birth year] Today's Date: 03/13/2016    History of present illness 60 yo admitted for AAA repair. PMHx smoker and ETOH use   OT comments  Pt is mod I with ADLs. He has all DME and wife will assist as needed.  All goals achieved at this time.  OT will discharge services.  Pt agreeable to this plan   Follow Up Recommendations  No OT follow up    Equipment Recommendations  None recommended by OT    Recommendations for Other Services      Precautions / Restrictions Precautions Precautions: Fall Precaution Comments: avoid belt around waist Restrictions Weight Bearing Restrictions: No       Mobility Bed Mobility Overal bed mobility: Modified Independent                Transfers Overall transfer level: Modified independent                    Balance Overall balance assessment: No apparent balance deficits (not formally assessed)                                 ADL Overall ADL's : Modified independent                                              Vision                     Perception     Praxis      Cognition   Behavior During Therapy: WFL for tasks assessed/performed Overall Cognitive Status: Within Functional Limits for tasks assessed                       Extremity/Trunk Assessment               Exercises     Shoulder Instructions       General Comments      Pertinent Vitals/ Pain       Pain Assessment: Faces Faces Pain Scale: Hurts a little bit Pain Location: abdomen Pain Descriptors / Indicators: Operative site guarding Pain Intervention(s): Monitored during session  Home Living                                          Prior Functioning/Environment              Frequency           Progress Toward Goals  OT Goals(current goals can now be  found in the care plan section)  Progress towards OT goals: Goals met/education completed, patient discharged from [place] All goals met and education completed, patient discharged from OT services    Co-evaluation                 End of Session Equipment Utilized During Treatment: Rolling walker   Activity Tolerance Patient tolerated treatment well   Patient Left in bed;with call bell/phone within reach   Nurse Communication Mobility status        Time: 4944-9675 OT Time Calculation (min): 12 min  Charges:  OT General Charges $OT Visit: 1 Procedure OT Treatments $Therapeutic Activity: 8-22 mins  Maguire Killmer M 03/13/2016, 10:02 PM

## 2016-03-14 NOTE — Progress Notes (Signed)
Subjective: Interval History: none.. Stable.  Tolerating diet, +BM  Objective: Vital signs in last 24 hours: Temp:  [97.7 F (36.5 C)-98.3 F (36.8 C)] 98.3 F (36.8 C) (10/28 0459) Pulse Rate:  [63-71] 63 (10/28 0459) Resp:  [18] 18 (10/28 0459) BP: (135-143)/(68-70) 143/69 (10/28 0459) SpO2:  [96 %-98 %] 96 % (10/28 0459)  Intake/Output from previous day: 10/27 0701 - 10/28 0700 In: 1200 [P.O.:1200] Out: -  Intake/Output this shift: Total I/O In: 240 [P.O.:240] Out: -   Abd soft.  Wd healing  Lab Results: No results for input(s): WBC, HGB, HCT, PLT in the last 72 hours. BMET No results for input(s): NA, K, CL, CO2, GLUCOSE, BUN, CREATININE, CALCIUM in the last 72 hours.  Studies/Results: Dg Chest Port 1 View  Result Date: 03/10/2016 CLINICAL DATA:  Postop from abdominal aortic aneurysm repair. EXAM: PORTABLE CHEST 1 VIEW COMPARISON:  03/09/2016 FINDINGS: Nasogastric tube and right jugular Cordis remain in appropriate position. Low lung volumes and bibasilar subsegmental atelectasis show no significant change. No evidence of pulmonary consolidation or edema. No evidence of pneumothorax or pleural effusion. IMPRESSION: No significant change in low lung volumes and mild bibasilar atelectasis. Electronically Signed   By: Myles RosenthalJohn  Stahl M.D.   On: 03/10/2016 08:59   Dg Chest Port 1 View  Result Date: 03/09/2016 CLINICAL DATA:  Status post aortic aneurysm repair EXAM: PORTABLE CHEST 1 VIEW COMPARISON:  None. FINDINGS: Right internal jugular central venous sheath terminates in the supraclavicular right neck. Enteric tube enters stomach with the tip not seen on this image. Normal heart size. Aortic atherosclerosis. Otherwise normal mediastinal contour. No pneumothorax. No pleural effusion. Low lung volumes. Bibasilar curvilinear opacities, favor atelectasis. No overt pulmonary edema. IMPRESSION: 1. Support structures as described.  No pneumothorax. 2. Low lung volumes with mild  bibasilar atelectasis. 3. Aortic atherosclerosis. Electronically Signed   By: Delbert PhenixJason A Poff M.D.   On: 03/09/2016 11:23   Dg Abd Portable 1v  Result Date: 03/09/2016 CLINICAL DATA:  Abdominal aortic aneurysm repair. EXAM: PORTABLE ABDOMEN - 1 VIEW COMPARISON:  Prior chest x-ray of 03/09/2016.  CT is 01/31/2016 . FINDINGS: Since the prior chest x-ray of today the NG tube appears to been withdrawn to the level of the left hemidiaphragm. Distal repositioning of approximately 10 cm suggested. Soft tissue structures are unremarkable. No bowel distention. No free air. Pelvic calcifications consistent phleboliths. Degenerative changes lumbar spine . Surgical clips noted the abdomen . IMPRESSION: Since the prior chest x-ray of today, the NG tube appears to been withdrawn to the level left hemidiaphragm. Advancement of the NG tube by approximately 10 cm suggested . These results will be called to the ordering clinician or representative by the Radiologist Assistant, and communication documented in the PACS or zVision Dashboard. Electronically Signed   By: Maisie Fushomas  Register   On: 03/09/2016 11:25   Anti-infectives: Anti-infectives    Start     Dose/Rate Route Frequency Ordered Stop   03/09/16 2000  cefUROXime (ZINACEF) 1.5 g in dextrose 5 % 50 mL IVPB     1.5 g 100 mL/hr over 30 Minutes Intravenous Every 12 hours 03/09/16 1153 03/10/16 0826   03/09/16 0626  cefUROXime (ZINACEF) 1.5 g in dextrose 5 % 50 mL IVPB     1.5 g 100 mL/hr over 30 Minutes Intravenous 30 min pre-op 03/09/16 0626 03/09/16 0800      Assessment/Plan: s/p Procedure(s): ANEURYSM ABDOMINAL AORTIC REPAIR (N/A) Stable for dc home   LOS: 5 days   Kyle Luna, Tawanna Coolerodd  03/14/2016, 10:54 AM

## 2016-03-15 NOTE — Progress Notes (Signed)
Pt/family given discharge instructions, medication lists, follow up appointments, and when to call the doctor.  Pt/family verbalizes understanding. Pt given signs and symptoms of infection. Keiasha Diep McClintock, RN    

## 2016-03-16 ENCOUNTER — Telehealth: Payer: Self-pay | Admitting: Vascular Surgery

## 2016-03-16 NOTE — Telephone Encounter (Signed)
LVM on pts mobile #, mailing letter as well for f/u 04/07/16

## 2016-03-16 NOTE — Telephone Encounter (Signed)
-----   Message from Sharee PimpleMarilyn K McChesney, RN sent at 03/15/2016  9:05 PM EDT ----- Regarding: 2-3 weeks w/ TFE   ----- Message ----- From: Raymond GurneyKimberly A Trinh, PA-C Sent: 03/13/2016   6:26 PM To: [facility] Charge Pool  S/p open AAA repair 03/09/2016  F/u with Dr. Arbie CookeyEarly in 2-3 weeks.   Thanks Selena BattenKim

## 2016-04-01 ENCOUNTER — Encounter: Payer: Self-pay | Admitting: Vascular Surgery

## 2016-04-07 ENCOUNTER — Ambulatory Visit (INDEPENDENT_AMBULATORY_CARE_PROVIDER_SITE_OTHER): Payer: Self-pay | Admitting: Vascular Surgery

## 2016-04-07 ENCOUNTER — Encounter: Payer: Self-pay | Admitting: Vascular Surgery

## 2016-04-07 VITALS — BP 111/70 | HR 100 | Temp 97.2°F | Resp 16 | Ht 75.0 in | Wt 217.0 lb

## 2016-04-07 DIAGNOSIS — Z9889 Other specified postprocedural states: Secondary | ICD-10-CM

## 2016-04-07 DIAGNOSIS — Z8679 Personal history of other diseases of the circulatory system: Secondary | ICD-10-CM

## 2016-04-07 NOTE — Progress Notes (Signed)
  POST OPERATIVE OFFICE NOTE    CC:  F/u for surgery  HPI:  This is a 60 y.o. male who is s/p open abdominal aortic aneurysm repair with a straight graft on 03/09/16 by Dr. Arbie CookeyEarly.  He presents today for his initial follow up post operative visit.  He states he is doing pretty well.  He has a good appetite.  His bowels are functioning normally (states better than before surgery and attributes it to his diet).   He is walking without difficulty.  He states when he is walking downhill, he can feel the pressure on his incision.  He has had a 12lb weight loss.    Allergies  Allergen Reactions  . No Known Allergies     Current Outpatient Prescriptions  Medication Sig Dispense Refill  . aspirin EC 81 MG tablet Take 1 tablet (81 mg total) by mouth daily. 30 tablet   . atorvastatin (LIPITOR) 10 MG tablet Take 1 tablet (10 mg total) by mouth daily. 30 tablet 11  . ibuprofen (ADVIL,MOTRIN) 200 MG tablet Take 400-600 mg by mouth every 6 (six) hours as needed (for knee pain.).    Marland Kitchen. Multiple Vitamin (MULTIVITAMIN WITH MINERALS) TABS tablet Take 1 tablet by mouth daily.    Marland Kitchen. omeprazole (PRILOSEC) 40 MG capsule Take 40 mg by mouth daily as needed. For acid reflux.  1  . oxyCODONE-acetaminophen (PERCOCET/ROXICET) 5-325 MG tablet Take 1-2 tablets by mouth every 6 (six) hours as needed for moderate pain or severe pain. (Patient not taking: Reported on 04/07/2016) 30 tablet 0   No current facility-administered medications for this visit.      ROS:  See HPI  Physical Exam:  Vitals:   04/07/16 1343  BP: 111/70  Pulse: 100  Resp: 16  Temp: 97.2 F (36.2 C)    Incision:  Well healed Pulse exam:  Right Left  Femoral 2+ (normal) 2+ (normal)  Popliteal 2+ (normal) 2+ (normal)  PT 2+ (normal) 2+ (normal)   Abdomen: soft, NT/ND; +BS   Assessment/Plan:  This is a 60 y.o. male who is s/p: open abdominal aortic aneurysm repair with a straight graft on 03/09/16 by Dr. Arbie CookeyEarly  -pt doing well today    -he has a good apetitie and his bowels are functioning normally -Dr. Arbie CookeyEarly discussed with pt that he shouldn't be lifting anything over 20 lbs until he is 3 months out from surgery.  -he is fine to return to work and drive -he is on a statin/aspirin -he will f/u in 3 months.   Doreatha MassedSamantha Rhyne, PA-C [facility] [phone]  Clinic MD:  Pt seen and examined with Dr. Arbie CookeyEarly  I have examined the patient, reviewed and agree with above. Doing quite well overall. He and his wife are pleased with his result. We'll see him again in 3 months for ongoing follow-up  Gretta BeganEarly, Harini Dearmond, MD 04/07/2016 2:18 PM

## 2016-04-16 NOTE — Discharge Summary (Signed)
[facility] Discharge Summary   Patient ID:  Kyle Luna MRN: [id number] DOB/AGE: 62-May-1957 62 y.o.  Admit date: 03/09/2016 Discharge date: 03/14/2016 Date of Surgery: 03/09/2016 Surgeon: Surgeon(s): Larina Earthlyodd F Early, MD  Admission Diagnosis: Abdominal aortic aneurysm I71.4  Discharge Diagnoses:  Abdominal aortic aneurysm I71.4  Secondary Diagnoses: Past Medical History:  Diagnosis Date  . GERD (gastroesophageal reflux disease)     Procedure(s): ANEURYSM ABDOMINAL AORTIC REPAIR  Discharged Condition: good  HPI: Kyle Luna a 60 y.o.malehere for continued discussion of his abdominal aortic aneurysm. I'd seen him several weeks ago in initial consultation. He was found also to have a 5.2 cm asymptomatic infrarenal abdominal aortic aneurysm. He underwent CT angiogram of his abdomen and pelvis on 01/31/2016. I have reviewed his actual films and reports and have reviewed his films today with the patient and his wife present. He has no symptoms of his aneurysm and no change in his medical history since her visit several weeks ago. He does have a cardiology appointment for clearance on 02/07/2016   Hospital Course:  Kyle Luna is a 60 y.o. male is S/P  Procedure(s): ANEURYSM ABDOMINAL AORTIC REPAIR  Abdominal incisional dressing with minimal spotting. Palpable DP 2+ pulses bilaterally Heart RRR Lungs non labored breathing   Assessment/Planning: POD # 1 open AAA NG output 300 cc last 12 hour shift with flushes NG D/C Start Dilaudid PCA Transfer to 3S  Palpable DP pulses 2+ Abdominal incision clean and dry, hypo bowel sounds Heart RRR Lungs non labored breathing  Assessment/Planning: POD # 2 open AAA  D/C foley  D/C introducer Maintain PCA Mobility encouraged Possible to start clear liquids today will discuss with Dr. Arbie CookeyEarly. May wait until tomorrow before he is transferred to 2W.  He is still having quit a bit of pain when he  moves and needs a lot of assistance.    Abdomen soft and nontender.  Midline incision c/d/i Palpable DP pulses bilaterally   Assessment/Planning: 60 y.o. male is s/p: open AAA repair 4 Days Post-Op   Tolerating regular food well. Small BM yesterday. He doesn't want another suppository.  Pain well controlled on po pain meds. Ambulating well with walker. Will need stair training.  Patient may shower. Patient wants to stay another day. Plan d/c home tomorrow.   POD#5 Stable tolerating PO's + BM, ambulating Discharge  Consults:    Significant Diagnostic Studies: CBC Lab Results  Component Value Date   WBC 11.9 (H) 03/10/2016   HGB 13.3 03/10/2016   HCT 39.6 03/10/2016   MCV 92.7 03/10/2016   PLT 229 03/10/2016    BMET    Component Value Date/Time   NA 137 03/10/2016 0345   K 3.6 03/10/2016 0345   CL 103 03/10/2016 0345   CO2 27 03/10/2016 0345   GLUCOSE 120 (H) 03/10/2016 0345   BUN 6 03/10/2016 0345   CREATININE 0.83 03/10/2016 0345   CALCIUM 8.7 (L) 03/10/2016 0345   GFRNONAA >60 03/10/2016 0345   GFRAA >60 03/10/2016 0345   COAG Lab Results  Component Value Date   INR 1.04 03/09/2016   INR 0.95 03/02/2016     Disposition:  Discharge to :Home    Medication List    TAKE these medications   aspirin EC 81 MG tablet Take 1 tablet (81 mg total) by mouth daily.   atorvastatin 10 MG tablet Commonly known as:  LIPITOR Take 1 tablet (10 mg total) by mouth daily.   ibuprofen 200 MG tablet Commonly known as:  ADVIL,MOTRIN Take 400-600 mg by mouth every 6 (six) hours as needed (for knee pain.).   multivitamin with minerals Tabs tablet Take 1 tablet by mouth daily.   omeprazole 40 MG capsule Commonly known as:  PRILOSEC Take 40 mg by mouth daily as needed. For acid reflux.   oxyCODONE-acetaminophen 5-325 MG tablet Commonly known as:  PERCOCET/ROXICET Take 1-2 tablets by mouth every 6 (six) hours as needed for moderate pain or severe pain.       Verbal and written Discharge instructions given to the patient. Wound care per Discharge AVS Follow-up Information    Early, Todd, MD In 2 weeks.   Specialties:  Vascular Surgery, Cardiology Why:  Our office will call you to arrange an appointment (sent) Contact information: [address] [place] [address] [phone]           Signed: Clinton GallantCOLLINS, Rowen Hur [facility] 04/16/2016, 9:10 AM - For VQI Registry use --- Instructions: Press F2 to tab through selections.  Delete question if not applicable.   Post-op:  Time to Extubation: [x ] In OR, [ ]  < 12 hrs, [ ]  12-24 hrs, [ ]  >=24 hrs Vasopressors Req. Post-op: No ICU Stay: 1 days Transfusion: No  If yes, 0 units given MI: [ ]  No, [ ]  Troponin only, [ ]  EKG or Clinical New Arrhythmia: No  Complications: CHF: No Resp failure: [x ] none, [ ]  Pneumonia, [ ]  Ventilator Chg in renal function: [x ] none, [ ]  Inc. Cr > 0.5, [ ]  Temp. Dialysis, [ ]  Permanent dialysis Leg ischemia: [x ] No, [ ]  Yes, no Surgery needed, [ ]  Yes, Surgery needed, [ ]  Amputation Bowel ischemia: [ x] No, [ ]  Medical Rx, [ ]  Surgical Rx Wound complication: [x ] No, [ ]  Superficial separation/infection, [ ]  Return to OR Return to OR: No  Return to OR for bleeding: No Stroke: [x ] None, [ ]  Minor, [ ]  Major  Discharge medications: Statin use:  No  for medical reason   ASA use:  No  for medical reason   Plavix use:  No  for medical reason   Beta blocker use: No  for medical reason

## 2016-06-11 ENCOUNTER — Encounter: Payer: Self-pay | Admitting: Family Medicine

## 2016-07-08 ENCOUNTER — Encounter: Payer: Self-pay | Admitting: Vascular Surgery

## 2016-07-14 ENCOUNTER — Encounter: Payer: Self-pay | Admitting: Vascular Surgery

## 2016-07-14 ENCOUNTER — Ambulatory Visit (INDEPENDENT_AMBULATORY_CARE_PROVIDER_SITE_OTHER): Payer: BLUE CROSS/BLUE SHIELD | Admitting: Vascular Surgery

## 2016-07-14 VITALS — BP 111/70 | HR 76 | Temp 98.5°F | Resp 16 | Ht 75.0 in | Wt 217.0 lb

## 2016-07-14 DIAGNOSIS — Z8679 Personal history of other diseases of the circulatory system: Secondary | ICD-10-CM | POA: Diagnosis not present

## 2016-07-14 DIAGNOSIS — Z9889 Other specified postprocedural states: Secondary | ICD-10-CM | POA: Diagnosis not present

## 2016-07-14 NOTE — Progress Notes (Signed)
    [facility]  Patient name: Kyle BogusRobert Bruna MRN: [id number] DOB: 25-Jun-1955 Sex: male  REASON FOR VISIT: Follow-up open aneurysm repair  HPI: Kyle Luna is a 61 y.o. male status post open abdominal aortic aneurysm repair on 03/09/2016. His aneurysm extended up to the renal arteries and therefore underwent open repair with a 14 mm straight graft. He had no perioperative complications. On my last visit with him he still had the typical diminished stamina and generalized soreness. This has resolved. He has no ongoing abdominal soreness in his regained weight loss around time of surgery. Has had normal return of appetite and bowel function  Past Medical History:  Diagnosis Date  . GERD (gastroesophageal reflux disease)     Family History  Problem Relation Age of Onset  . Adopted: Yes    SOCIAL HISTORY: Social History  Substance Use Topics  . Smoking status: Current Every Day Smoker    Packs/day: 0.50  . Smokeless tobacco: Never Used     Comment: Less than 3 cigarettes per day.   . Alcohol use 2.4 - 3.0 oz/week    4 - 5 Cans of beer per week    Allergies  Allergen Reactions  . No Known Allergies     Current Outpatient Prescriptions  Medication Sig Dispense Refill  . aspirin EC 81 MG tablet Take 1 tablet (81 mg total) by mouth daily. 30 tablet   . atorvastatin (LIPITOR) 10 MG tablet Take 1 tablet (10 mg total) by mouth daily. 30 tablet 11  . Multiple Vitamin (MULTIVITAMIN WITH MINERALS) TABS tablet Take 1 tablet by mouth daily.    Marland Kitchen. omeprazole (PRILOSEC) 40 MG capsule Take 40 mg by mouth daily as needed. For acid reflux.  1   No current facility-administered medications for this visit.     REVIEW OF SYSTEMS:  [X]  denotes positive finding, [ ]  denotes negative finding Cardiac  Comments:  Chest pain or chest pressure:    Shortness of breath upon exertion:    Short of breath when lying flat:    Irregular  heart rhythm:        Vascular    Pain in calf, thigh, or hip brought on by ambulation:    Pain in feet at night that wakes you up from your sleep:     Blood clot in your veins:    Leg swelling:           PHYSICAL EXAM: Vitals:   07/14/16 1609  BP: 111/70  Pulse: 76  Resp: 16  Temp: 98.5 F (36.9 C)  TempSrc: Oral  SpO2: 93%  Weight: 217 lb (98.4 kg)  Height: 6\' 3"  (1.905 m)    GENERAL: The patient is a well-nourished male, in no acute distress. The vital signs are documented above. CARDIOVASCULAR: 2+ radial and 2+ dorsalis pedis pulses bilaterally Abdomen soft. Well-healed midline incision. No hernias noted. PULMONARY: There is good air exchange  MUSCULOSKELETAL: There are no major deformities or cyanosis. NEUROLOGIC: No focal weakness or paresthesias are detected. SKIN: There are no ulcers or rashes noted. PSYCHIATRIC: The patient has a normal affect.   MEDICAL ISSUES: Stable now 4 months status post open aneurysm repair. He will resume full activities including lifting with no restrictions. We will see him for final follow-up in 6 months with ankle arm indices at that time    Larina Earthlyodd F. Alaura Schippers, MD [facility] Office Tel [phone] Pager [phone]

## 2016-07-15 NOTE — Addendum Note (Signed)
Addended by: Burton ApleyPETTY, Zaylei Mullane A on: 07/15/2016 04:42 PM   Modules accepted: Orders

## 2017-01-12 ENCOUNTER — Ambulatory Visit: Payer: BLUE CROSS/BLUE SHIELD | Admitting: Vascular Surgery

## 2017-01-12 ENCOUNTER — Encounter (HOSPITAL_COMMUNITY): Payer: BLUE CROSS/BLUE SHIELD

## 2017-02-09 ENCOUNTER — Encounter: Payer: Self-pay | Admitting: Vascular Surgery

## 2017-02-09 ENCOUNTER — Ambulatory Visit (INDEPENDENT_AMBULATORY_CARE_PROVIDER_SITE_OTHER): Payer: Managed Care, Other (non HMO) | Admitting: Vascular Surgery

## 2017-02-09 ENCOUNTER — Ambulatory Visit (HOSPITAL_COMMUNITY)
Admission: RE | Admit: 2017-02-09 | Discharge: 2017-02-09 | Disposition: A | Discharge status: Home / Self Care | Payer: Managed Care, Other (non HMO) | Source: Ambulatory Visit | Attending: Vascular Surgery | Admitting: Vascular Surgery

## 2017-02-09 VITALS — BP 128/69 | HR 83 | Ht 75.0 in | Wt 230.0 lb

## 2017-02-09 DIAGNOSIS — Z9889 Other specified postprocedural states: Secondary | ICD-10-CM

## 2017-02-09 DIAGNOSIS — Z8679 Personal history of other diseases of the circulatory system: Secondary | ICD-10-CM

## 2017-02-09 NOTE — Progress Notes (Signed)
HISTORY AND PHYSICAL     CC:  Follow up to surgery Requesting Provider:  Lewis Moccasin, MD  HPI: This is a 61 y.o. male who is status post open abdominal aortic aneurysm repair on 03/09/2016. His aneurysm extended up to the renal arteries and therefore underwent open repair with a 14 mm straight graft.  At his visit in February, he ws doing well, but still with post operative fatigue.  Today, he states he is back to normal without problems.    He takes a stain for cholesterol management and he takes a daily aspirin.  Past Medical History:  Diagnosis Date  . GERD (gastroesophageal reflux disease)     Past Surgical History:  Procedure Laterality Date  . ABDOMINAL AORTIC ANEURYSM REPAIR N/A 03/09/2016   Procedure: ANEURYSM ABDOMINAL AORTIC REPAIR;  Surgeon: Larina Earthly, MD;  Location: [facility] OR;  Service: Vascular;  Laterality: N/A;  . ANTERIOR CRUCIATE LIGAMENT REPAIR    . NECK SURGERY     20 years ago    Allergies  Allergen Reactions  . No Known Allergies     Current Outpatient Prescriptions  Medication Sig Dispense Refill  . aspirin EC 81 MG tablet Take 1 tablet (81 mg total) by mouth daily. 30 tablet   . atorvastatin (LIPITOR) 10 MG tablet Take 1 tablet (10 mg total) by mouth daily. 30 tablet 11  . Multiple Vitamin (MULTIVITAMIN WITH MINERALS) TABS tablet Take 1 tablet by mouth daily.    Marland Kitchen omeprazole (PRILOSEC) 40 MG capsule Take 40 mg by mouth daily as needed. For acid reflux.  1   No current facility-administered medications for this visit.     Family History  Problem Relation Age of Onset  . Adopted: Yes    Social History   Social History  . Marital status: Married    Spouse name: N/A  . Number of children: N/A  . Years of education: N/A   Occupational History  . Not on file.   Social History Main Topics  . Smoking status: Current Every Day Smoker    Packs/day: 0.50  . Smokeless tobacco: Never Used     Comment: Less than 3 cigarettes per day.   .  Alcohol use 2.4 - 3.0 oz/week    4 - 5 Cans of beer per week  . Drug use: No  . Sexual activity: Not on file   Other Topics Concern  . Not on file   Social History Narrative  . No narrative on file     REVIEW OF SYSTEMS:    denotes positive finding,  denotes negative finding Cardiac  Comments:  Chest pain or chest pressure:    Shortness of breath upon exertion:    Short of breath when lying flat:    Irregular heart rhythm:        Vascular    Pain in calf, thigh, or hip brought on by ambulation:    Pain in feet at night that wakes you up from your sleep:     Blood clot in your veins:    Leg swelling:         Pulmonary    Oxygen at home:    Productive cough:     Wheezing:         Neurologic    Sudden weakness in arms or legs:     Sudden numbness in arms or legs:     Sudden onset of difficulty speaking or slurred speech:    Temporary loss  of vision in one eye:     Problems with dizziness:         Gastrointestinal    Blood in stool:     Vomited blood:         Genitourinary    Burning when urinating:     Blood in urine:        Psychiatric    Major depression:         Hematologic    Bleeding problems:    Problems with blood clotting too easily:        Skin    Rashes or ulcers:        Constitutional    Fever or chills:      PHYSICAL EXAMINATION:  Vitals:   02/09/17 1413  BP: 128/69  Pulse: 83  SpO2: 96%   Vitals:   02/09/17 1413  Weight: 230 lb (104.3 kg)  Height:  (1.905 m)   Body mass index is 28.75 kg/m.  General:  WDWN in NAD; vital signs documented above Gait: Not observed HENT: WNL, normocephalic Pulmonary: normal non-labored breathing , without Rales, rhonchi,  wheezing Cardiac: regular HR, without  Murmurs without carotid bruits Abdomen: soft, NT, no masses Skin: without rashes Vascular Exam/Pulses:  Right Left  Radial 2+ (normal) 2+ (normal)  Femoral 2+ (normal) 2+ (normal)  Popliteal 2+ (normal) 2+ (normal)  DP 2+  (normal) 2+ (normal)  PT 2+ (normal) 2+ (normal)   Extremities: without ischemic changes, without Gangrene , without cellulitis; without open wounds;  Musculoskeletal: no muscle wasting or atrophy  Neurologic: A&O X 3;  No focal weakness or paresthesias are detected Psychiatric:  The pt has Normal affect.   Non-Invasive Vascular Imaging:   ABI's 02/09/17: Right:  1.12 Triphasic DP/PT Left:  1.11 Triphasic DP/PT  Pt meds includes: Statin:  Yes.   Beta Blocker:  No. Aspirin:  Yes.   ACEI:  No. ARB:  No. CCB use:  No Other Antiplatelet/Anticoagulant:  No    ASSESSMENT/PLAN:: 61 y.o. male s/p open repair of AAA with straight 14mm straight graft.    -pt is doing well and has completely recovered from surgery.  His ABI's today are normal.   -Dr. Arbie Cookey discussed with pt that he could develop an incisional hernia in the future that may need repair.  He will see [place] back on an as needed basis.     Doreatha Massed, PA-C [facility] [phone]  Clinic MD:  Pt seen and examined with Dr. Arbie Cookey  I have examined the patient, reviewed and agree with above. Doing quite well. We will not have any active ongoing follow-up. He will notify us if he has vascular issues in the future.  Gretta Began, MD 02/09/2017 3:08 PM     VASCULAR QUALITY INITIATIVE FOLLOW UP DATA:  Current smoker: [  ] yes  [ x ] no  Living status: [  x]  Home  [  ] Nursing home  [  ] Homeless    MEDS:  ASA [  x] yes  [  ] no- [  ] medical reason  [  ] non compliant  STATIN  [ x ] yes  [  ] no- [  ] medical reason  [  ] non compliant  Beta blocker [  ] yes  [  x] no- [  ] medical reason  [  ] non compliant  ACE inhibitor [  ] yes  [x  ] no- [  ] medical  reason  [  ] non compliant  P2Y12 Antagonist [ x ] none  [  ] clopidogrel-Plavix  [  ] ticlopidine-Ticlid   [  ] prasugrel-Effient  [  ] ticagrelor- Brilinta    Anticoagulant [ x ] None  [  ] warfarin  [  ] rivaroxaban-Xarelto [  ]  dabigatran- Pradaxa  Number of subsequent operations related to AAA = 0  Subsequent operation performed for: [  ] Incision [  ] Graft [  ] Intestine       [  ] leg ischemia

## 2017-03-12 IMAGING — CT CT CTA ABD/PEL W/CM AND/OR W/O CM
1 of 6 series · 14 of 36 positions shown, 18 images · IV contrast (APPLIED)
Comparison: 12/24/2015 ultrasound

CLINICAL DATA: 5.2 cm abdominal aortic aneurysm by ultrasound

EXAM:
CT ANGIOGRAPHY ABDOMEN AND PELVIS
TECHNIQUE: Multidetector CT imaging of the abdomen and pelvis was performed
using the standard protocol during bolus administration of
intravenous contrast. Multiplanar reconstructed images including
MIPs were obtained and reviewed to evaluate the vascular anatomy.
CONTRAST:  80 cc Isovue 370

[Series 11: angio thin · axial · 0.92mm/px · z∈[-624,-146]mm · 14 of 530 slices shown, 18 images]
[im 26/530  soft-tissue]
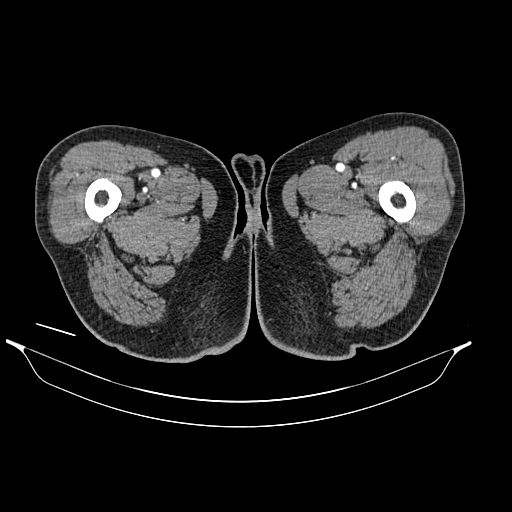
[im 26/530  bone]
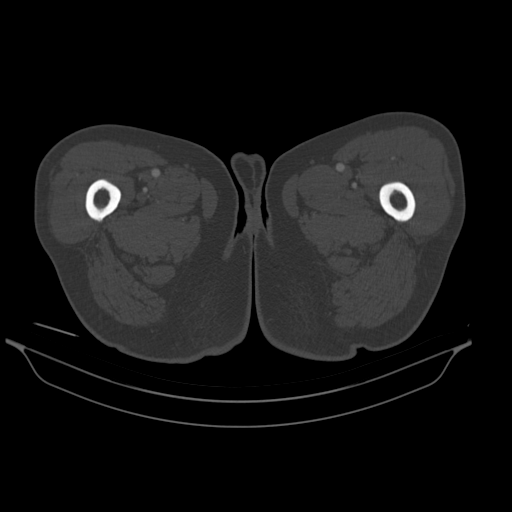
[im 76/530  soft-tissue]
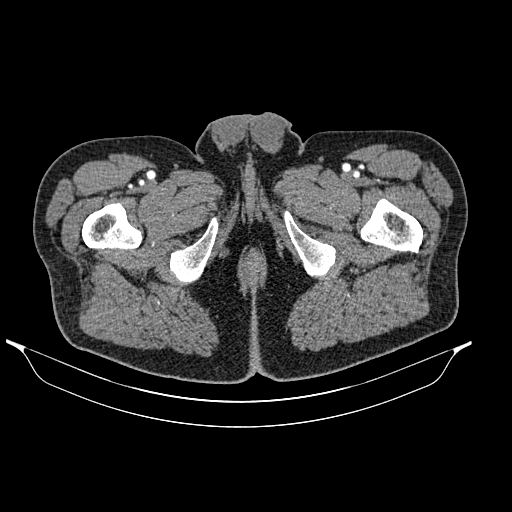
[im 126/530  soft-tissue]
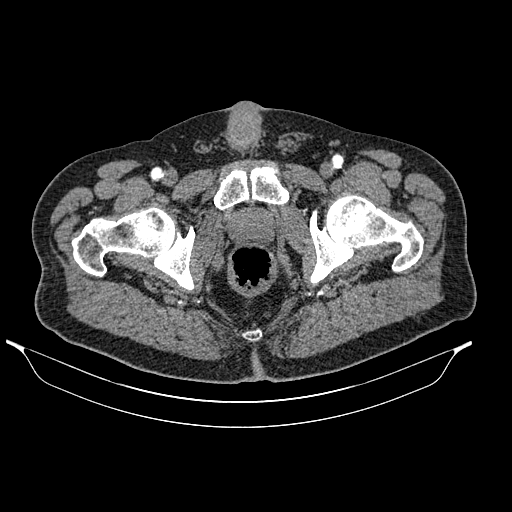
[im 152/530  soft-tissue]
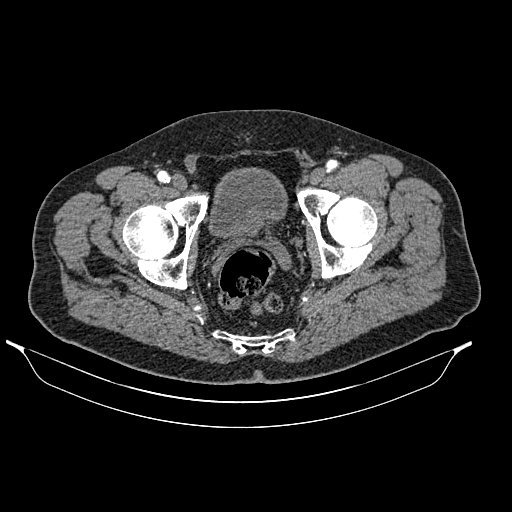
[im 202/530  soft-tissue]
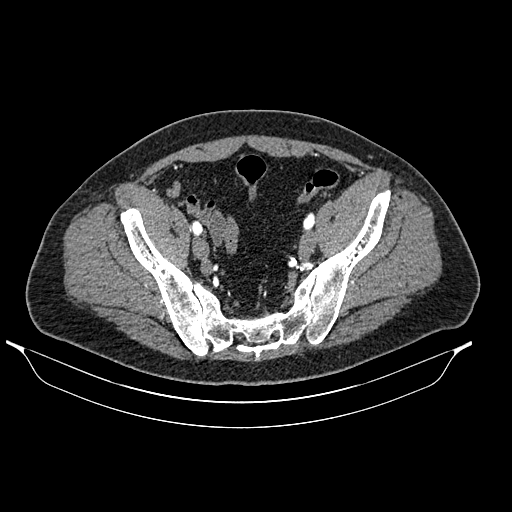
[im 252/530  soft-tissue]
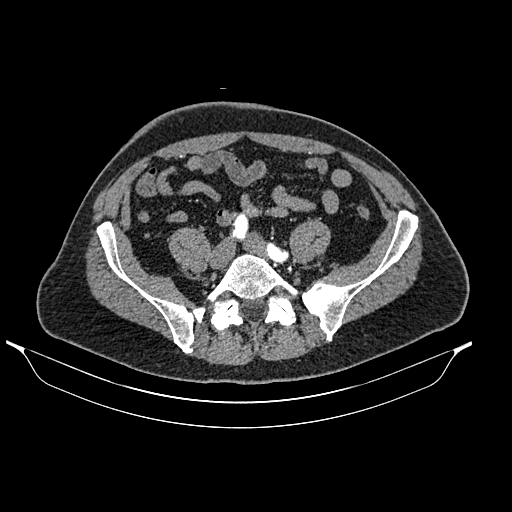
[im 278/530  soft-tissue]
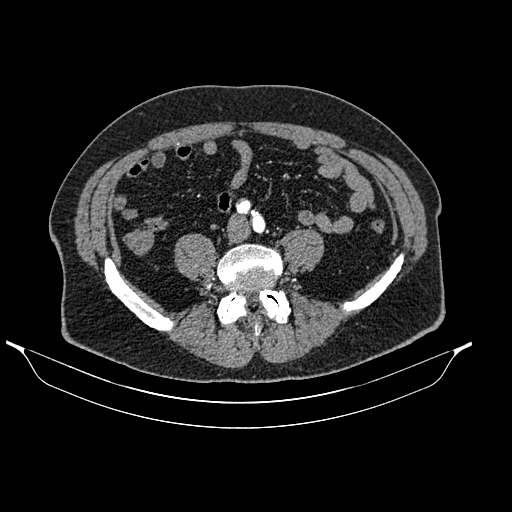
[im 328/530  soft-tissue]
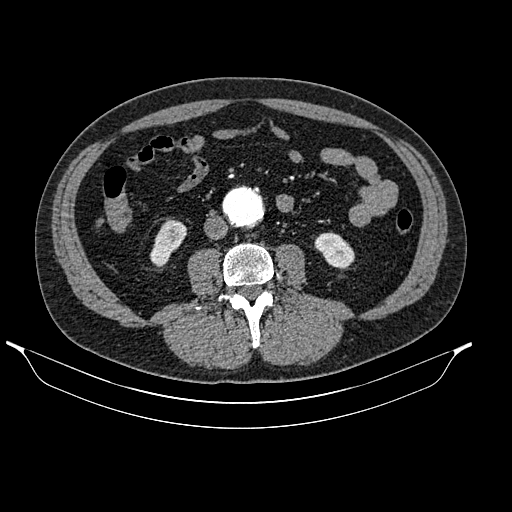
[im 378/530  soft-tissue]
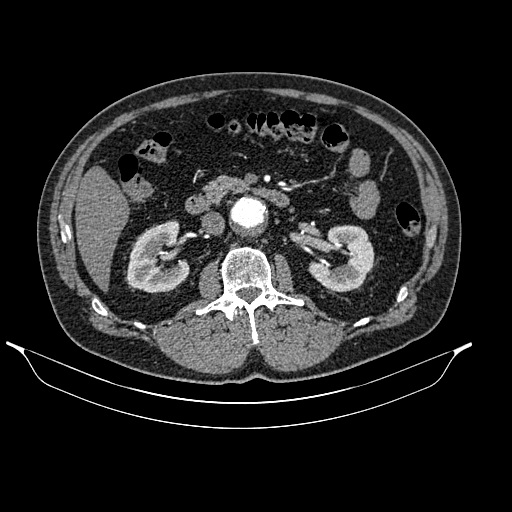
[im 378/530  bone]
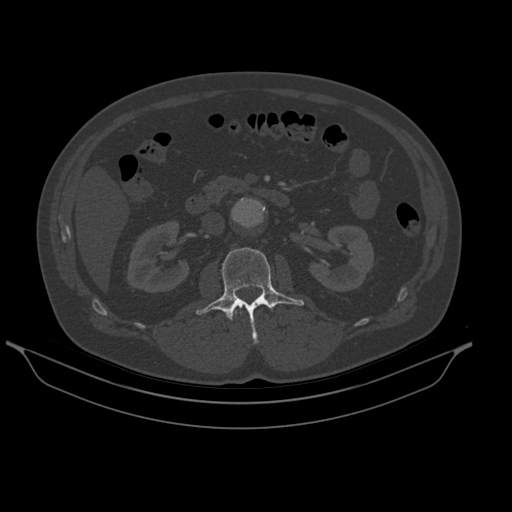
[im 404/530  soft-tissue]
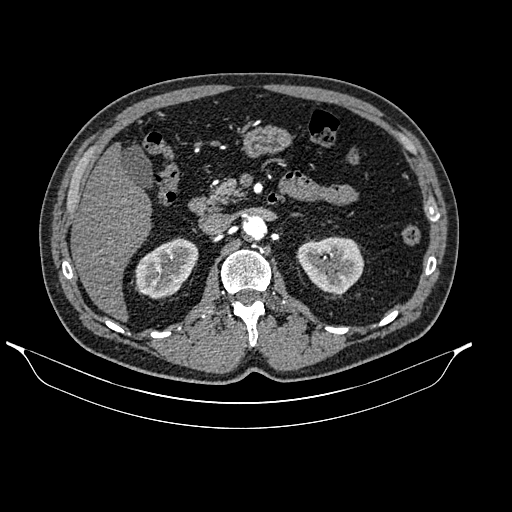
[im 429/530  lung]
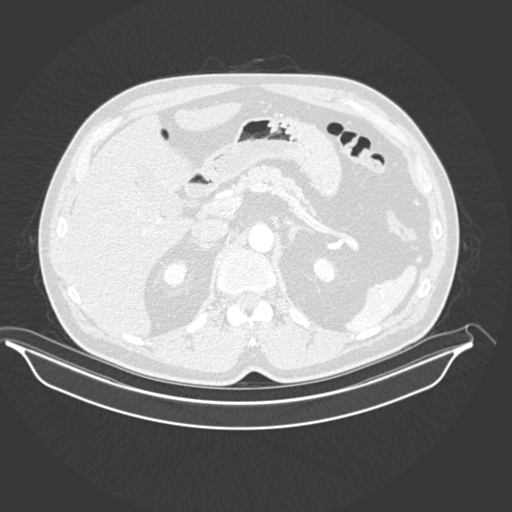
[im 454/530  soft-tissue]
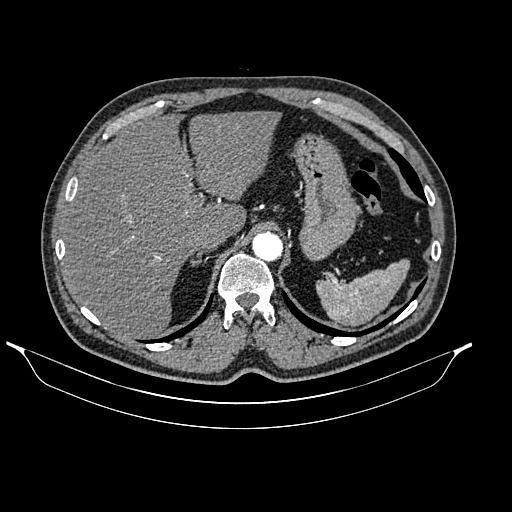
[im 454/530  lung]
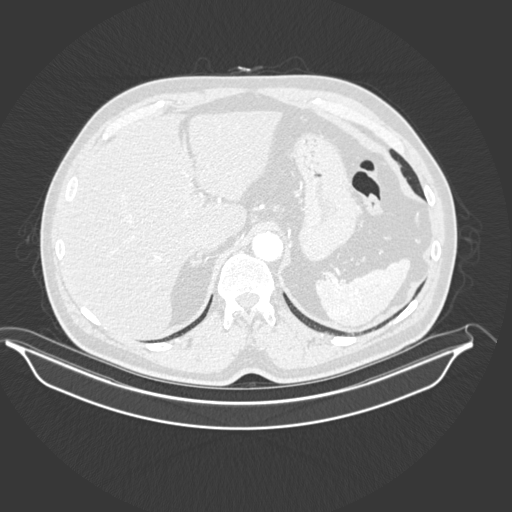
[im 479/530  lung]
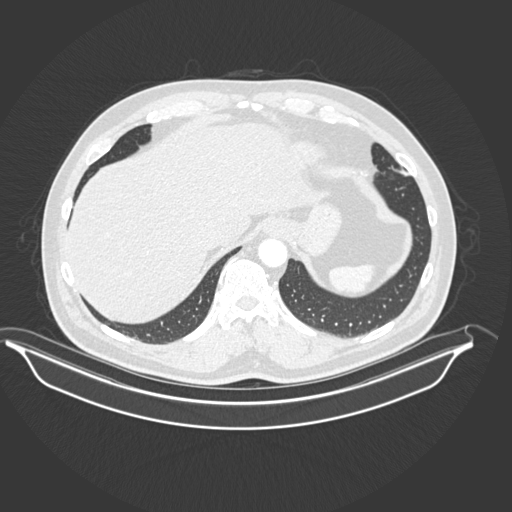
[im 504/530  soft-tissue]
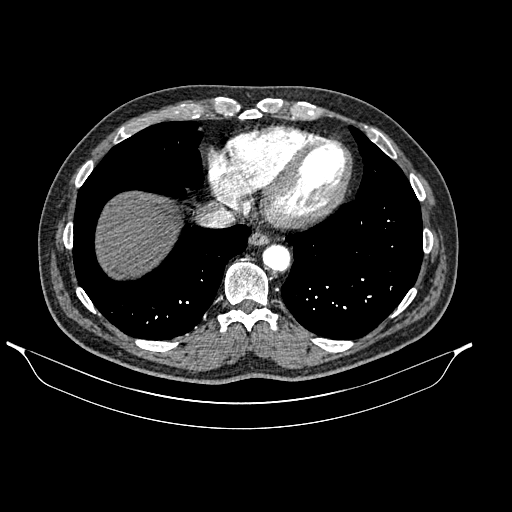
[im 504/530  lung]
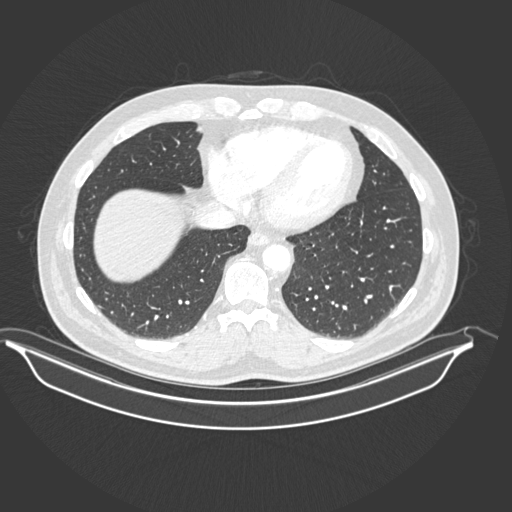

[14 of 36 positions shown; findings below may reference images not displayed]

FINDINGS: Arterial findings:

Aorta: Visualized lower thoracic descending aorta is intact with
minor atherosclerosis. Infrarenal aorta demonstrates a fusiform
mildly lobulated atherosclerotic aneurysm, maximal AP diameter
cm and transverse 5.3 cm, image 61. Aneurysm extends to but does not
involve the bifurcation. Minor intramural thrombus and cleft like
atherosclerosis within the aneurysm lumen. No associated dissection.
No retroperitoneal hemorrhage or acute finding.

Celiac axis:          Widely patent

Superior mesenteric:  Atherosclerotic origin but widely patent

Left renal: Main left renal artery and accessory left renal artery
are both widely patent

Right renal: Main left renal artery and 2 accessory right renal
arteries are all patent

Inferior mesenteric: IMA origin remains patent off of the aneurysm
anteriorly

Left iliac: Left common, internal and external iliac arteries are
atherosclerotic and tortuous but remain patent. No iliac aneurysm or
occlusion.

Right iliac: Right common, internal and external iliac arteries also
remain patent without aneurysm or occlusion.

Visualized common femoral, proximal profunda femoral, and proximal
superficial femoral arteries are atherosclerotic but patent
bilaterally.

Venous findings: Hepatic, portal, IVC, mesenteric, splenic, and
renal veins all appear patent. No Saeid process.

Review of the MIP images confirms the above findings.

Nonvascular findings:

Lower chest: Clear lung bases. Normal heart size. No pericardial or
pleural effusion.

Abdomen and pelvis: Hypoattenuation of the liver parenchyma
compatible with hepatic steatosis or fatty infiltration. No definite
focal hepatic abnormality or biliary dilatation. Gallbladder biliary
system within normal limits.

Pancreas, spleen, adrenal glands, and kidneys are within normal
limits for age and arterial phase imaging.

Negative for bowel obstruction, significant dilatation, ileus, or
free air. Normal appendix demonstrated. No fluid collection or
abscess.

Negative for adenopathy.

No inguinal abnormality or hernia. Urinary bladder unremarkable.
Prostate calcifications noted without significant enlargement.

Lower lumbar facet arthropathy. No other acute osseous finding or
fracture
IMPRESSION: 5.1 x 5.3 cm infrarenal atherosclerotic fusiform abdominal aortic
aneurysm without other acute process.

Mesenteric and renal vasculature all remain patent.

Iliac atherosclerosis and tortuosity without aneurysm or occlusive
disease.

Hepatic steatosis

No other acute intra-abdominal or pelvic process.

## 2017-04-20 IMAGING — CR DG CHEST 1V PORT
1 series · 1 of 1 positions shown · non-contrast
Comparison: 03/09/2016

CLINICAL DATA: Postop from abdominal aortic aneurysm repair.

EXAM:
PORTABLE CHEST 1 VIEW

[AP]
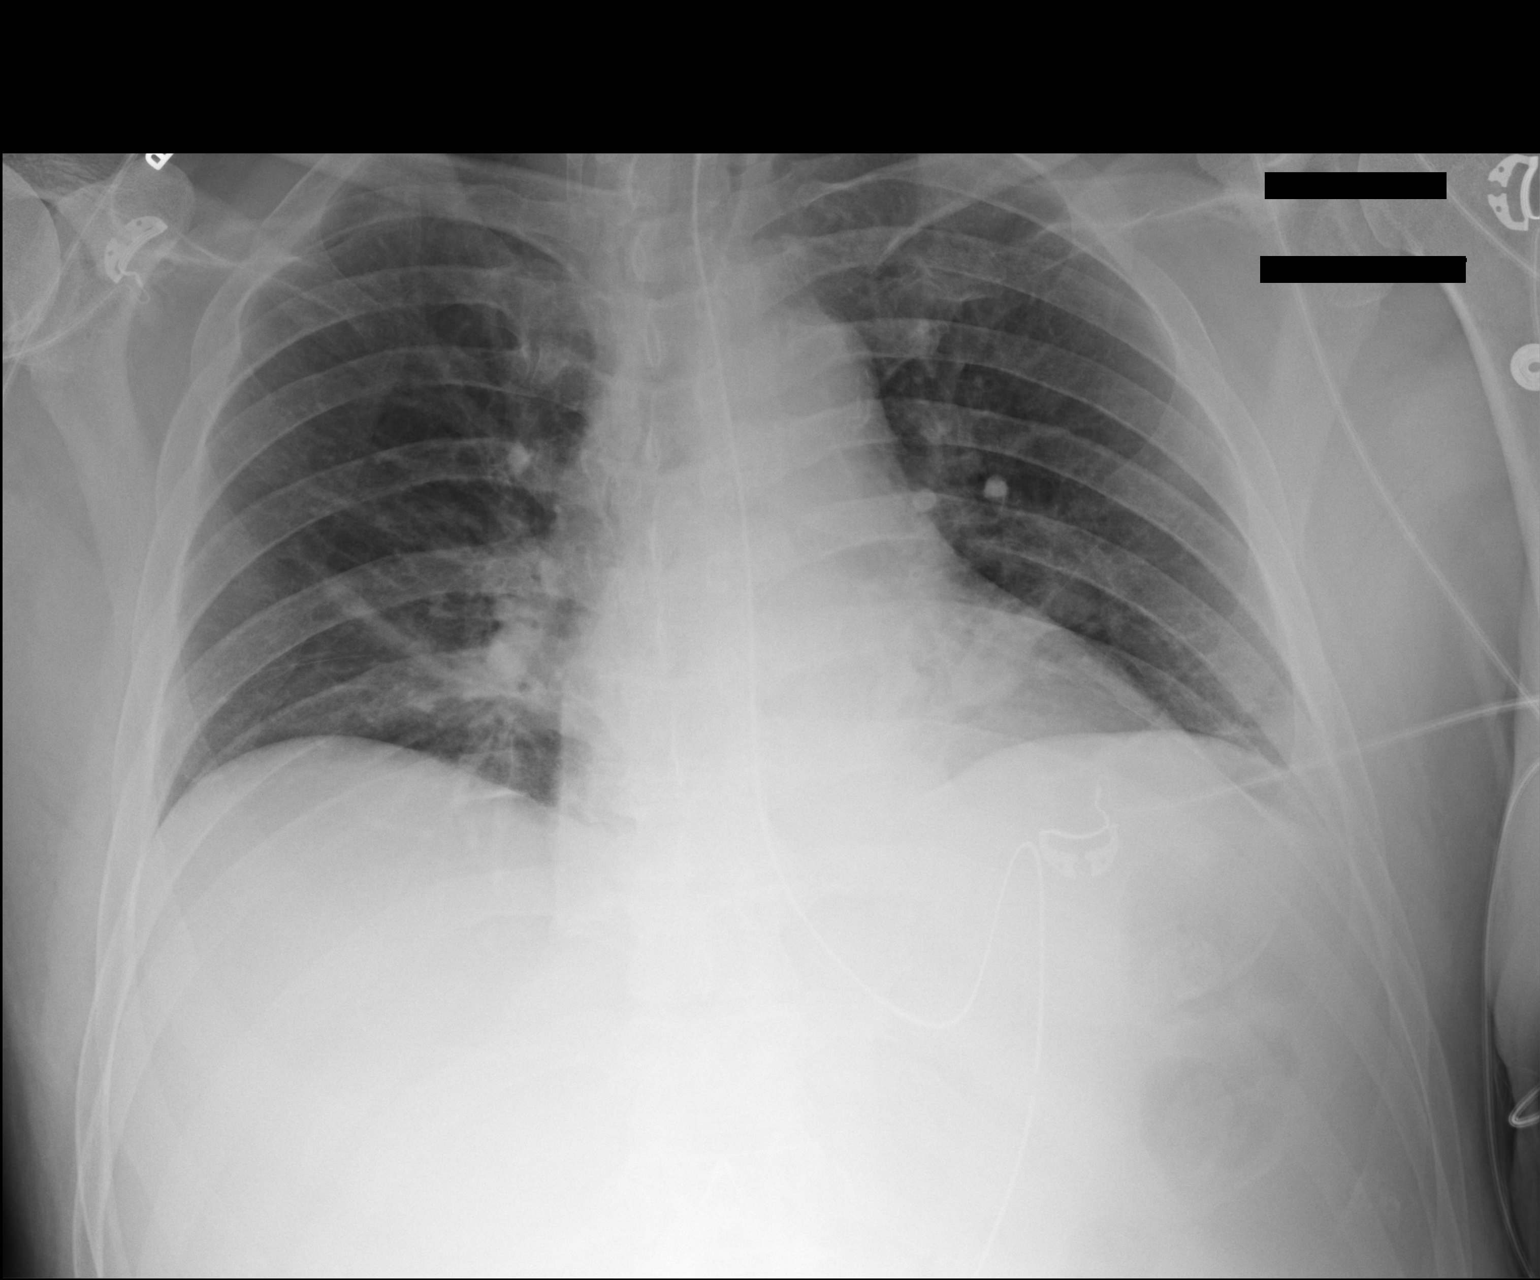

[1 of 1 positions shown; findings below may reference images not displayed]

FINDINGS: Nasogastric tube and right jugular Cordis remain in appropriate
position.

Low lung volumes and bibasilar subsegmental atelectasis show no
significant change. No evidence of pulmonary consolidation or edema.
No evidence of pneumothorax or pleural effusion.
IMPRESSION: No significant change in low lung volumes and mild bibasilar
atelectasis.

## 2017-07-14 ENCOUNTER — Encounter: Payer: Self-pay | Admitting: Family Medicine
